# Patient Record
Sex: Female | Born: 1990 | Race: White | Hispanic: No | Marital: Single | Smoking: Never smoker
Health system: Southern US, Community
[De-identification: ages and names within clinical notes are randomized; demographics above are authoritative.]

## PROBLEM LIST (undated history)

## (undated) DIAGNOSIS — S42309A Unspecified fracture of shaft of humerus, unspecified arm, initial encounter for closed fracture: Secondary | ICD-10-CM

## (undated) DIAGNOSIS — R519 Headache, unspecified: Secondary | ICD-10-CM

## (undated) DIAGNOSIS — N946 Dysmenorrhea, unspecified: Secondary | ICD-10-CM

## (undated) DIAGNOSIS — N939 Abnormal uterine and vaginal bleeding, unspecified: Secondary | ICD-10-CM

## (undated) HISTORY — PX: FRACTURE SURGERY: SHX138

## (undated) HISTORY — PX: ABDOMINAL HYSTERECTOMY: SHX81

---
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
## 2015-06-12 ENCOUNTER — Encounter: Payer: Self-pay | Admitting: *Deleted

## 2015-06-15 ENCOUNTER — Ambulatory Visit: Payer: Managed Care, Other (non HMO) | Admitting: Anesthesiology

## 2015-06-15 ENCOUNTER — Encounter
Admission: RE | Disposition: A | Discharge status: Home / Self Care | Payer: Self-pay | Source: Ambulatory Visit | Attending: Gastroenterology

## 2015-06-15 ENCOUNTER — Encounter: Payer: Self-pay | Admitting: *Deleted

## 2015-06-15 ENCOUNTER — Ambulatory Visit
Admission: RE | Admit: 2015-06-15 | Discharge: 2015-06-15 | Disposition: A | Discharge status: Home / Self Care | Payer: Managed Care, Other (non HMO) | Source: Ambulatory Visit | Attending: Gastroenterology | Admitting: Gastroenterology

## 2015-06-15 DIAGNOSIS — N939 Abnormal uterine and vaginal bleeding, unspecified: Secondary | ICD-10-CM | POA: Diagnosis not present

## 2015-06-15 DIAGNOSIS — N946 Dysmenorrhea, unspecified: Secondary | ICD-10-CM | POA: Insufficient documentation

## 2015-06-15 DIAGNOSIS — Z79899 Other long term (current) drug therapy: Secondary | ICD-10-CM | POA: Insufficient documentation

## 2015-06-15 DIAGNOSIS — R197 Diarrhea, unspecified: Secondary | ICD-10-CM | POA: Diagnosis present

## 2015-06-15 HISTORY — PX: COLONOSCOPY WITH PROPOFOL: SHX5780

## 2015-06-15 HISTORY — DX: Unspecified fracture of shaft of humerus, unspecified arm, initial encounter for closed fracture: S42.309A

## 2015-06-15 HISTORY — DX: Abnormal uterine and vaginal bleeding, unspecified: N93.9

## 2015-06-15 HISTORY — DX: Dysmenorrhea, unspecified: N94.6

## 2015-06-15 LAB — C DIFFICILE QUICK SCREEN W PCR REFLEX
C DIFFICILE (CDIFF) INTERP: NEGATIVE
C DIFFICILE (CDIFF) TOXIN: NEGATIVE
C DIFFICLE (CDIFF) ANTIGEN: NEGATIVE

## 2015-06-15 LAB — POCT PREGNANCY, URINE: PREG TEST UR: NEGATIVE

## 2015-06-15 SURGERY — COLONOSCOPY WITH PROPOFOL
Anesthesia: General

## 2015-06-15 MED ORDER — PROPOFOL 500 MG/50ML IV EMUL
INTRAVENOUS | Status: DC | PRN
  Administered 2015-06-15: 120 ug/kg/min via INTRAVENOUS

## 2015-06-15 MED ORDER — LIDOCAINE HCL (CARDIAC) 20 MG/ML IV SOLN
INTRAVENOUS | Status: DC | PRN
  Administered 2015-06-15: 50 mg via INTRAVENOUS

## 2015-06-15 MED ORDER — PROPOFOL 10 MG/ML IV BOLUS
INTRAVENOUS | Status: DC | PRN
  Administered 2015-06-15 (×2): 30 mg via INTRAVENOUS
  Administered 2015-06-15: 70 mg via INTRAVENOUS

## 2015-06-15 MED ORDER — MIDAZOLAM HCL 5 MG/5ML IJ SOLN
INTRAMUSCULAR | Status: DC | PRN
  Administered 2015-06-15: 1 mg via INTRAVENOUS

## 2015-06-15 MED ORDER — SODIUM CHLORIDE 0.9 % IV SOLN
INTRAVENOUS | Status: DC
  Administered 2015-06-15: 14:00:00 via INTRAVENOUS

## 2015-06-15 MED ORDER — FENTANYL CITRATE (PF) 100 MCG/2ML IJ SOLN
INTRAMUSCULAR | Status: DC | PRN
  Administered 2015-06-15: 50 ug via INTRAVENOUS

## 2015-06-15 NOTE — Op Note (Signed)
[facility] Gastroenterology Patient Name: Nicole Boone Procedure Date: 06/15/2015 2:59 PM MRN: [id number] Account #: [id number] Date of Birth: February 09, 1991 Admit Type: Outpatient Age: 24 Room: [facility] ENDO ROOM 2 Gender: Female Note Status: Finalized Procedure:         Colonoscopy Indications:       This is the patient's first colonoscopy, Chronic diarrhea Patient Profile:   This is a 24 year old female. Providers:         Gerrit Heck. Rayann Heman, MD Medicines:         Propofol per Anesthesia Complications:     No immediate complications. Procedure:         Pre-Anesthesia Assessment:                    - Prior to the procedure, a History and Physical was                     performed, and patient medications, allergies and                     sensitivities were reviewed. The patient's tolerance of                     previous anesthesia was reviewed.                    After obtaining informed consent, the colonoscope was                     passed under direct vision. Throughout the procedure, the                     patient's blood pressure, pulse, and oxygen saturations                     were monitored continuously. The Colonoscope was                     introduced through the anus and advanced to the the                     terminal ileum. The colonoscopy was performed without                     difficulty. The patient tolerated the procedure well. The                     quality of the bowel preparation was excellent. Did not                     retroflex in rectum due to pt intolerance. Findings:      The perianal and digital rectal examinations were normal.      The terminal ileum appeared normal.      The entire examined colon appeared normal.      Biopsies for histology were taken with a cold forceps from the right       colon, left colon and rectum for evaluation of microscopic colitis. Impression:        - The examined portion of the ileum was normal.               - The entire examined colon is normal.                    - No specimens collected.  Recommendation:    - Observe patient in GI recovery unit.                    - Resume regular diet.                    - Continue present medications.                    - Await pathology results.                    - Send stool for c.diff testing again today given sister                     with c.diff                    - If c.diff negative today, repeat o andp and c.diff in 2                     - 3 days once back to baseline stool pattern.                    - Return to GI clinic.                    - The findings and recommendations were discussed with the                     patient.                    - The findings and recommendations were discussed with the                     patient's family. Procedure Code(s): --- Professional ---                    [phone], Colonoscopy, flexible; with biopsy, single or                     multiple CPT copyright 2014 American Medical Association. All rights reserved. The codes documented in this report are preliminary and upon coder review may  be revised to meet current compliance requirements. Mellody Life, MD 06/15/2015 3:29:08 PM This report has been signed electronically. Number of Addenda: 0 Note Initiated On: 06/15/2015 2:59 PM Scope Withdrawal Time: 0 hours 11 minutes 21 seconds  Total Procedure Duration: 0 hours 16 minutes 31 seconds       [facility]

## 2015-06-15 NOTE — H&P (Signed)
  Primary Care Physician:  [facility]  Pre-Procedure History & Physical: HPI:  Nicole Boone is a 24 y.o. female is here for an colonoscopy.   Past Medical History  Diagnosis Date  . Dysmenorrhea   . Abnormal uterine bleeding   . Broken arm 2007    left    History reviewed. No pertinent past surgical history.  Prior to Admission medications   Medication Sig Start Date End Date Taking? Authorizing Provider  levonorgestrel-ethinyl estradiol (AVIANE,ALESSE,LESSINA) 0.1-20 MG-MCG tablet Take 1 tablet by mouth daily.   Yes Historical Provider, MD  metroNIDAZOLE (FLAGYL) 500 MG tablet Take 500 mg by mouth 3 (three) times daily.    Historical Provider, MD    Allergies as of 06/02/2015  . (Not on File)    History reviewed. No pertinent family history.  Social History   Social History  . Marital Status: Single    Spouse Name: N/A  . Number of Children: N/A  . Years of Education: N/A   Occupational History  . Not on file.   Social History Main Topics  . Smoking status: Never Smoker   . Smokeless tobacco: Never Used  . Alcohol Use: 0.6 oz/week    1 Glasses of wine per week  . Drug Use: No  . Sexual Activity: Not on file   Other Topics Concern  . Not on file   Social History Narrative     Physical Exam: BP 117/78 mmHg  Pulse 99  Temp(Src) 98.6 F (37 C) (Tympanic)  Resp 14  Ht 5\' 9"  (1.753 m)  Wt 81.647 kg (180 lb)  BMI 26.57 kg/m2  SpO2 98%  LMP 06/15/2015 General:   Alert,  pleasant and cooperative in NAD Head:  Normocephalic and atraumatic. Neck:  Supple; no masses or thyromegaly. Lungs:  Clear throughout to auscultation.    Heart:  Regular rate and rhythm. Abdomen:  Soft, nontender and nondistended. Normal bowel sounds, without guarding, and without rebound.   Neurologic:  Alert and  oriented x4;  grossly normal neurologically.  Impression/Plan: Nicole Boone is here for an colonoscopy to be performed for diarrhea  Risks, benefits,  limitations, and alternatives regarding  colonoscopy have been reviewed with the patient.  Questions have been answered.  All parties agreeable.   Josefine Class, MD  06/15/2015, 2:59 PM

## 2015-06-15 NOTE — Anesthesia Postprocedure Evaluation (Signed)
  Anesthesia Post-op Note  Patient: Nicole Boone  Procedure(s) Performed: Procedure(s): COLONOSCOPY WITH PROPOFOL (N/A)  Anesthesia type:General  Patient location: PACU  Post pain: Pain level controlled  Post assessment: Post-op Vital signs reviewed, Patient's Cardiovascular Status Stable, Respiratory Function Stable, Patent Airway and No signs of Nausea or vomiting  Post vital signs: Reviewed and stable  Last Vitals:  Filed Vitals:   06/15/15 1600  BP: 97/65  Pulse: 74  Temp:   Resp: 16    Level of consciousness: awake, alert  and patient cooperative  Complications: No apparent anesthesia complications

## 2015-06-15 NOTE — Anesthesia Preprocedure Evaluation (Signed)
Anesthesia Evaluation  Patient identified by MRN, date of birth, ID band Patient awake    Reviewed: Allergy & Precautions, NPO status , Patient's Chart, lab work & pertinent test results, reviewed documented beta blocker date and time   Airway Mallampati: II  TM Distance: >3 FB     Dental  (+) Chipped   Pulmonary          Cardiovascular     Neuro/Psych    GI/Hepatic   Endo/Other    Renal/GU      Musculoskeletal   Abdominal   Peds  Hematology   Anesthesia Other Findings   Reproductive/Obstetrics                             Anesthesia Physical Anesthesia Plan  ASA: II  Anesthesia Plan: General   Post-op Pain Management:    Induction:   Airway Management Planned: Nasal Cannula  Additional Equipment:   Intra-op Plan:   Post-operative Plan:   Informed Consent: I have reviewed the patients History and Physical, chart, labs and discussed the procedure including the risks, benefits and alternatives for the proposed anesthesia with the patient or authorized representative who has indicated his/her understanding and acceptance.     Plan Discussed with: CRNA  Anesthesia Plan Comments:         Anesthesia Quick Evaluation  

## 2015-06-15 NOTE — Transfer of Care (Signed)
Immediate Anesthesia Transfer of Care Note  Patient: Nicole Boone  Procedure(s) Performed: Procedure(s): COLONOSCOPY WITH PROPOFOL (N/A)  Patient Location: PACU  Anesthesia Type:General  Level of Consciousness: awake  Airway & Oxygen Therapy: Patient Spontanous Breathing  Post-op Assessment: Report given to RN  Post vital signs: Reviewed  Last Vitals:  Filed Vitals:   06/15/15 1529  BP: 94/59  Pulse:   Temp: 36.2 C  Resp: 22    Complications: No apparent anesthesia complications

## 2015-06-16 ENCOUNTER — Encounter: Payer: Self-pay | Admitting: Gastroenterology

## 2015-06-17 LAB — SURGICAL PATHOLOGY

## 2017-02-02 ENCOUNTER — Other Ambulatory Visit: Payer: Self-pay | Admitting: Nurse Practitioner

## 2017-02-02 DIAGNOSIS — G43419 Hemiplegic migraine, intractable, without status migrainosus: Secondary | ICD-10-CM

## 2017-02-09 ENCOUNTER — Ambulatory Visit
Admission: RE | Admit: 2017-02-09 | Discharge: 2017-02-09 | Disposition: A | Discharge status: Home / Self Care | Payer: 59 | Source: Ambulatory Visit | Attending: Nurse Practitioner | Admitting: Nurse Practitioner

## 2017-02-09 DIAGNOSIS — G43419 Hemiplegic migraine, intractable, without status migrainosus: Secondary | ICD-10-CM | POA: Insufficient documentation

## 2017-02-09 MED ORDER — GADOBENATE DIMEGLUMINE 529 MG/ML IV SOLN
20.0000 mL | Freq: Once | INTRAVENOUS | Status: AC | PRN
  Administered 2017-02-09: 19 mL via INTRAVENOUS

## 2017-09-20 ENCOUNTER — Other Ambulatory Visit
Admission: RE | Admit: 2017-09-20 | Discharge: 2017-09-20 | Disposition: A | Discharge status: Home / Self Care | Payer: 59 | Source: Ambulatory Visit | Attending: Student | Admitting: Student

## 2017-09-20 DIAGNOSIS — K529 Noninfective gastroenteritis and colitis, unspecified: Secondary | ICD-10-CM | POA: Insufficient documentation

## 2017-09-20 LAB — GASTROINTESTINAL PANEL BY PCR, STOOL (REPLACES STOOL CULTURE)

## 2017-09-20 LAB — C DIFFICILE QUICK SCREEN W PCR REFLEX
C DIFFICILE (CDIFF) TOXIN: NEGATIVE
C DIFFICLE (CDIFF) ANTIGEN: POSITIVE — AB

## 2017-09-20 LAB — CLOSTRIDIUM DIFFICILE BY PCR, REFLEXED: CDIFFPCR: NEGATIVE

## 2017-09-21 LAB — PANCREATIC ELASTASE, FECAL: Pancreatic Elastase-1, Stool: >500 ug Elast./g (ref >200)

## 2017-09-25 LAB — CALPROTECTIN, FECAL: Calprotectin, Fecal: <16 ug/g (ref 0–120)

## 2018-02-09 IMAGING — MR MR HEAD WO/W CM
10 of 12 series · 30 of 48 positions shown · IV contrast (19ML MULTIHANCE)
Comparison: None.

CLINICAL DATA: Chronic migraines, worst in the past 6 months.
Nausea, vomiting, blurred vision, and left-sided numbness.

EXAM:
MRI HEAD WITHOUT AND WITH CONTRAST
TECHNIQUE: Multiplanar, multiecho pulse sequences of the brain and surrounding
structures were obtained without and with intravenous contrast.
CONTRAST:  19mL MULTIHANCE GADOBENATE DIMEGLUMINE 529 MG/ML IV SOLN

[Series 2: T1 · sagittal · 5.0mm · 0.47mm/px · 1 of 22 slices shown (1 of 2)]
[im 1/22]
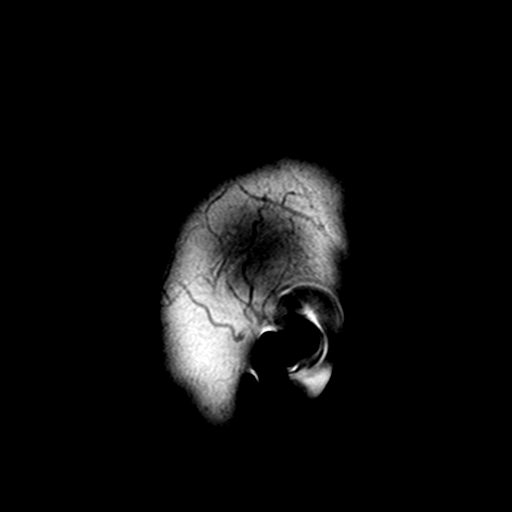

[Series 4: DWI · axial · 3.0mm · 0.94mm/px · z∈[-93,+53]mm · 3 of 50 slices shown (1 of 2)]
[im 1/50]
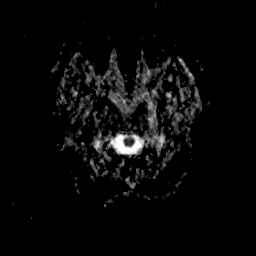
[im 25/50]
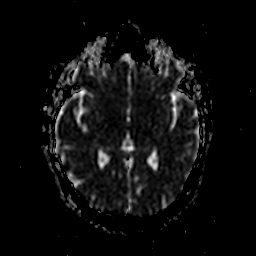
[im 50/50]
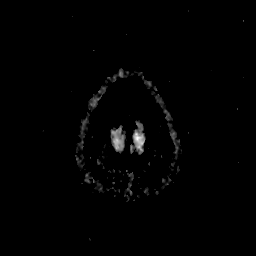

[Series 7: DWI · coronal · 5.0mm · 1.80mm/px · 2 of 38 slices shown (2 of 2)]
[im 1/38]
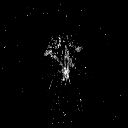
[im 38/38]
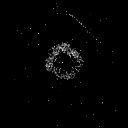

[Series 9: T2 · axial · 5.0mm · 0.45mm/px · z∈[-95,+58]mm · 2 of 23 slices shown (1 of 2)]
[im 1/23]
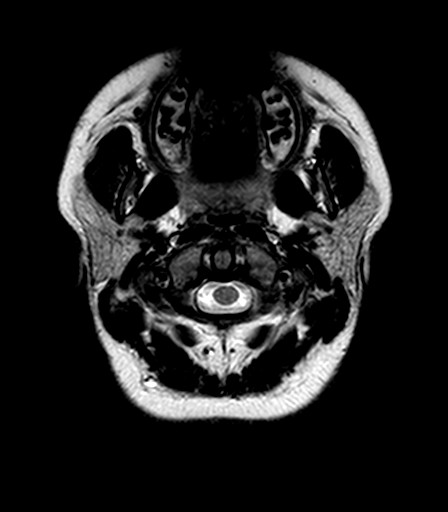
[im 23/23]
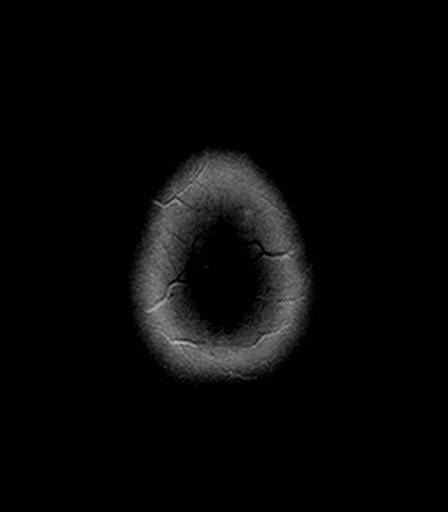

[Series 10: FLAIR · axial · 3.0mm · 0.90mm/px · z∈[-91,+56]mm · 4 of 50 slices shown]
[im 1/50]
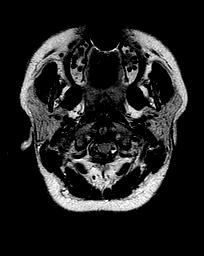
[im 17/50]
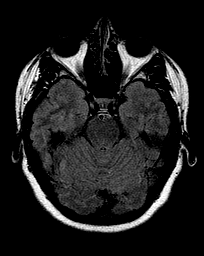
[im 33/50]
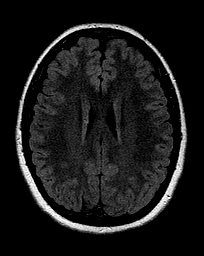
[im 50/50]
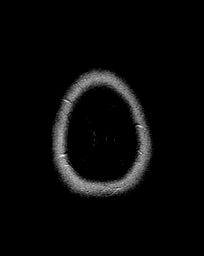

[Series 11: T2 · axial · 5.0mm · 0.45mm/px · z∈[-95,+58]mm · 2 of 23 slices shown (2 of 2)]
[im 1/23]
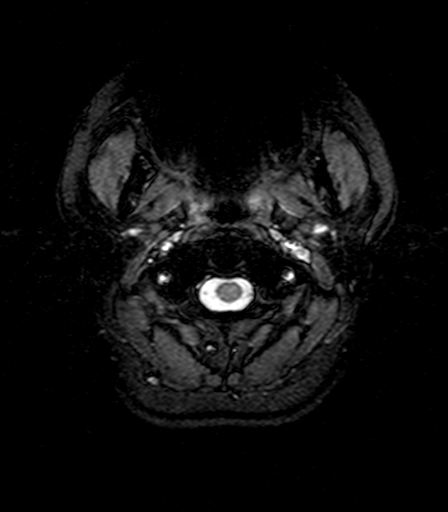
[im 23/23]
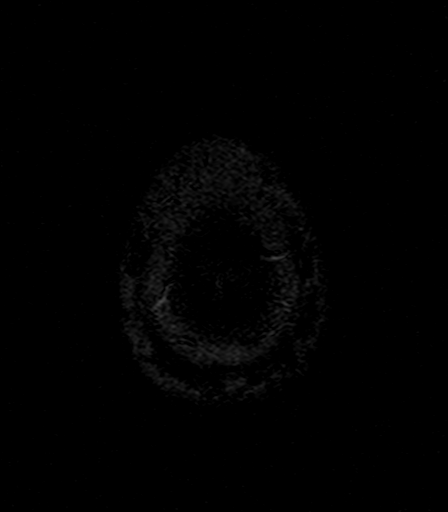

[Series 12: T1 · axial · 1.0mm · 0.45mm/px · z∈[-97,-54]mm · 3 of 160 slices shown (2 of 2)]
[im 1/160]
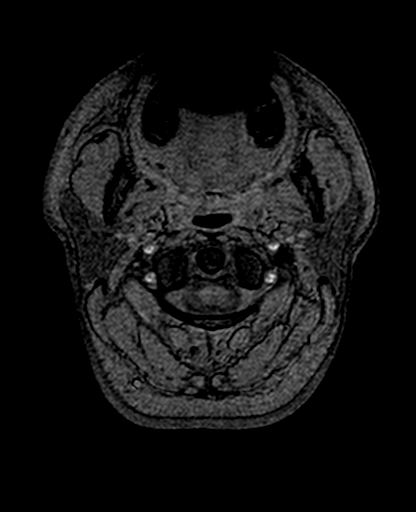
[im 29/160]
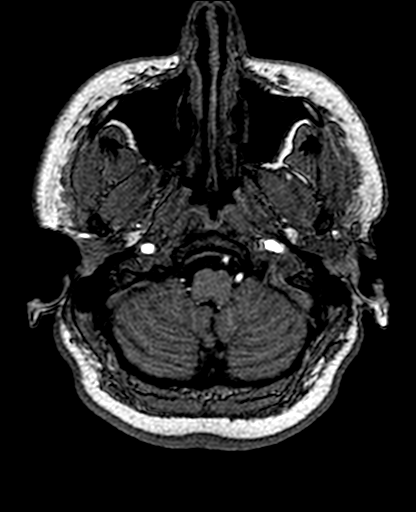
[im 44/160]
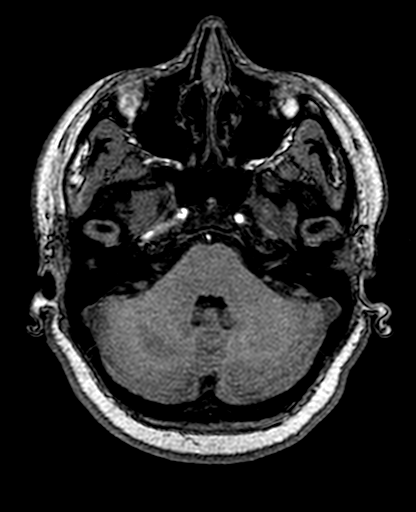

[Series 13: T2 post-contrast · coronal · 5.0mm · 0.45mm/px · 2 of 27 slices shown]
[im 1/27]
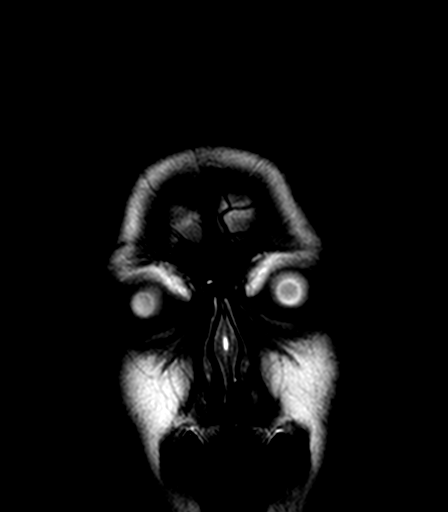
[im 27/27]
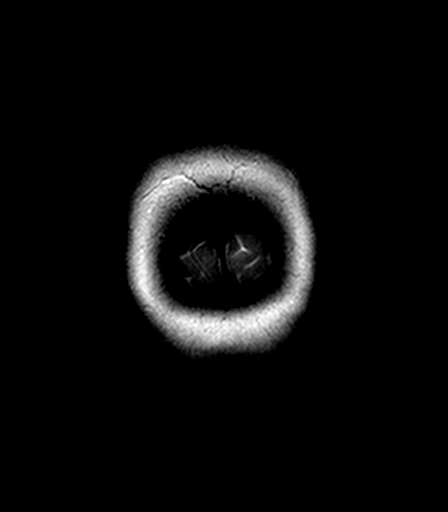

[Series 14: T1 post-contrast · axial · 1.0mm · 0.45mm/px · z∈[-97,+61]mm · 9 of 160 slices shown (1 of 2)]
[im 1/160]
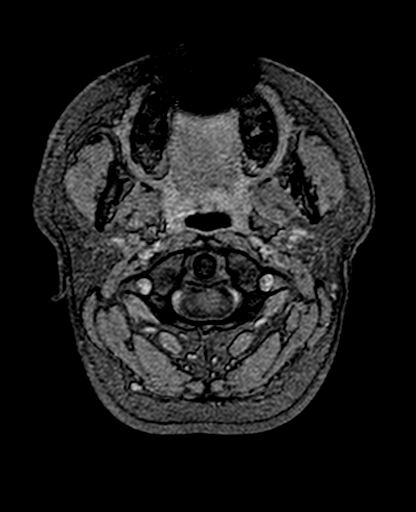
[im 29/160]
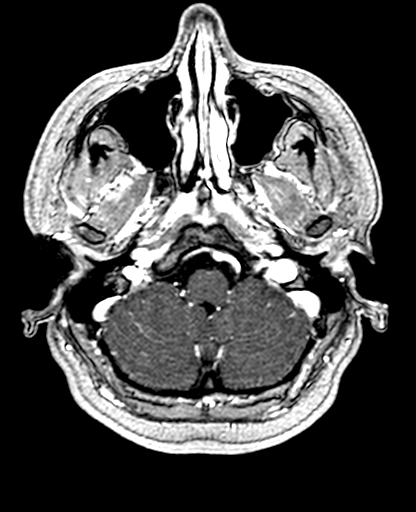
[im 44/160]
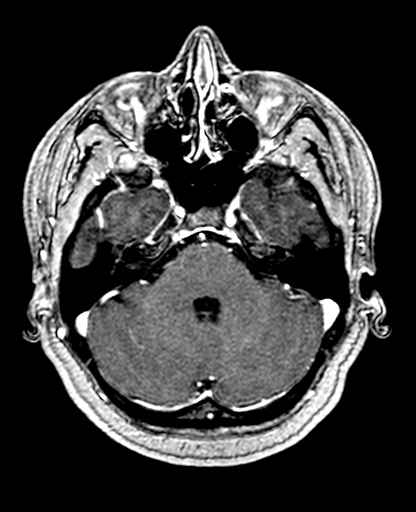
[im 73/160]
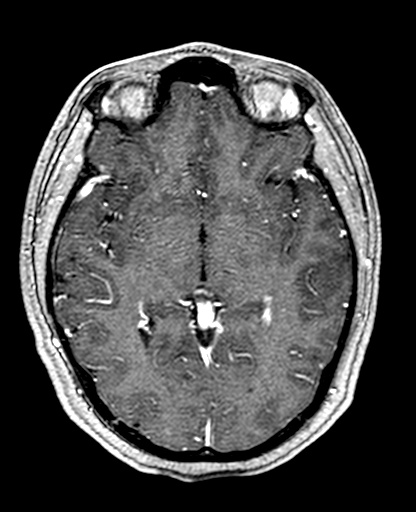
[im 87/160]
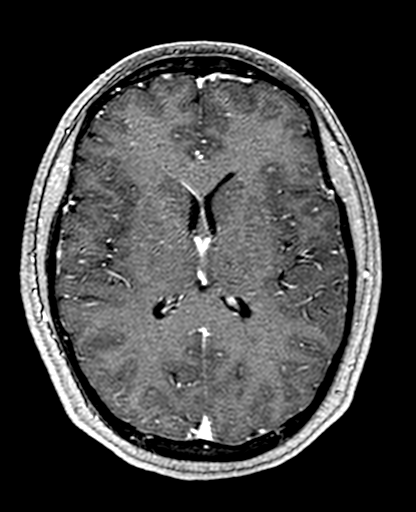
[im 116/160]
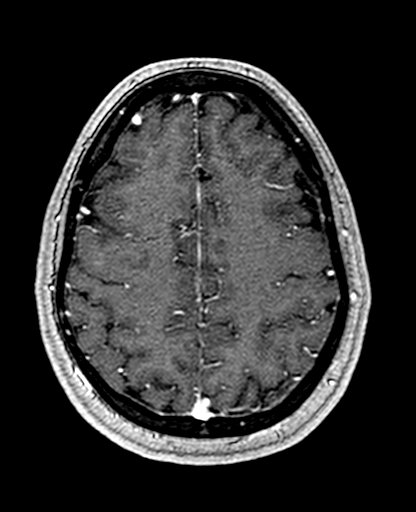
[im 131/160]
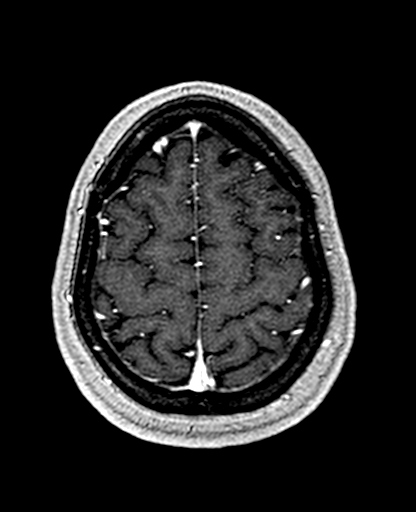
[im 145/160]
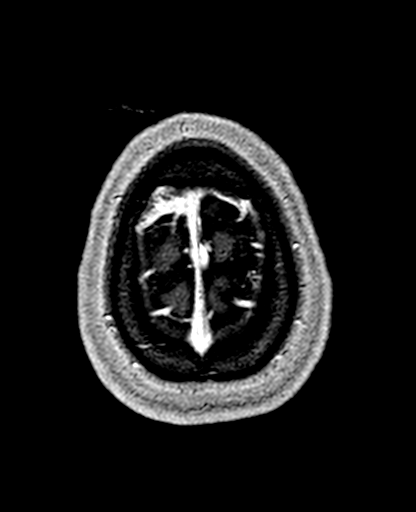
[im 160/160]
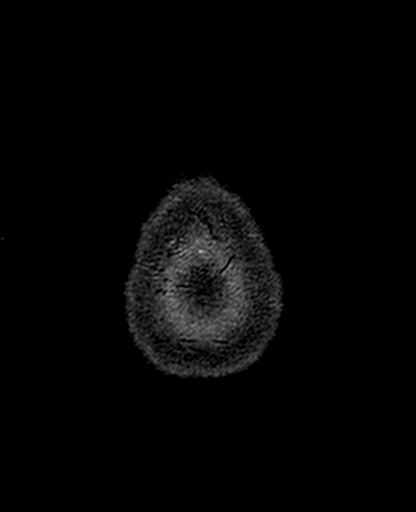

[Series 15: T1 post-contrast · coronal · 5.0mm · 0.45mm/px · 2 of 27 slices shown (2 of 2)]
[im 1/27]
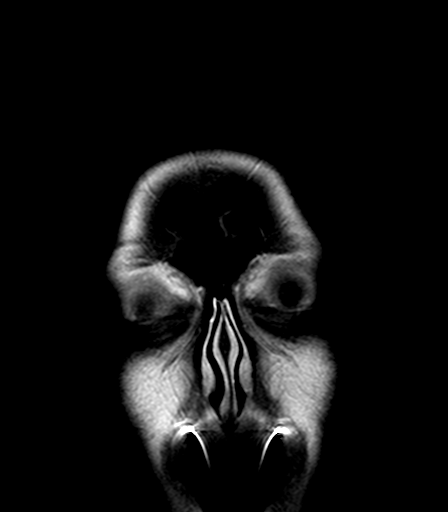
[im 27/27]
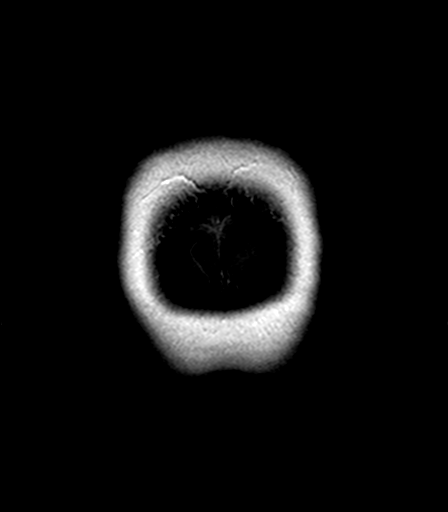

[30 of 48 positions shown; findings below may reference images not displayed]

FINDINGS: Brain: There is no evidence of acute infarct, intracranial
hemorrhage, mass, midline shift, or extra-axial fluid collection.
The ventricles and sulci are normal. The brain is normal in signal.
No abnormal enhancement is identified.

Vascular: Major intracranial vascular flow voids are preserved.

Skull and upper cervical spine: Unremarkable bone marrow signal.

Sinuses/Orbits: Unremarkable orbits. Minimal left greater than right
maxillary sinus mucosal thickening. Clear mastoid air cells.

Other: None.
IMPRESSION: Unremarkable appearance of the brain.

## 2018-03-17 ENCOUNTER — Other Ambulatory Visit: Payer: Self-pay

## 2018-03-17 DIAGNOSIS — H60331 Swimmer's ear, right ear: Secondary | ICD-10-CM | POA: Insufficient documentation

## 2018-03-17 DIAGNOSIS — H9201 Otalgia, right ear: Secondary | ICD-10-CM | POA: Diagnosis present

## 2018-03-17 DIAGNOSIS — Z79899 Other long term (current) drug therapy: Secondary | ICD-10-CM | POA: Insufficient documentation

## 2018-03-17 NOTE — ED Triage Notes (Signed)
Reports went to walking in yesterday and told her ear drum ruptured.  Reports now ear (right) is swollen shut and can't hear out of it.

## 2018-03-18 ENCOUNTER — Emergency Department
Admission: EM | Admit: 2018-03-18 | Discharge: 2018-03-18 | Disposition: A | Discharge status: Home / Self Care | Payer: 59 | Attending: Emergency Medicine | Admitting: Emergency Medicine

## 2018-03-18 DIAGNOSIS — H60331 Swimmer's ear, right ear: Secondary | ICD-10-CM

## 2018-03-18 MED ORDER — IBUPROFEN 600 MG PO TABS
600.0000 mg | ORAL_TABLET | Freq: Once | ORAL | Status: AC
  Administered 2018-03-18: 600 mg via ORAL
  Filled 2018-03-18: qty 1

## 2018-03-18 MED ORDER — IBUPROFEN 600 MG PO TABS: 600.0000 mg | ORAL_TABLET | Freq: Three times a day (TID) | ORAL | 0 refills | Status: DC | PRN

## 2018-03-18 MED ORDER — TETRACAINE HCL 0.5 % OP SOLN
2.0000 [drp] | Freq: Once | OPHTHALMIC | Status: AC
  Administered 2018-03-18: 2 [drp] via OPHTHALMIC

## 2018-03-18 MED ORDER — HYDROCODONE-ACETAMINOPHEN 5-325 MG PO TABS: 1.0000 | ORAL_TABLET | Freq: Four times a day (QID) | ORAL | 0 refills | Status: DC | PRN

## 2018-03-18 MED ORDER — TETRACAINE HCL 0.5 % OP SOLN
OPHTHALMIC | Status: AC
  Filled 2018-03-18: qty 4

## 2018-03-18 MED ORDER — HYDROCODONE-ACETAMINOPHEN 5-325 MG PO TABS
1.0000 | ORAL_TABLET | Freq: Once | ORAL | Status: AC
  Administered 2018-03-18: 1 via ORAL
  Filled 2018-03-18: qty 1

## 2018-03-18 NOTE — Discharge Instructions (Signed)
Please do not go swimming until your ear is completely healed.  Use your eardrops in your right ear 4 drops 2 times a day for a full week and make an appointment to follow-up with the ear nose and throat doctor within 1 to 2 weeks for recheck.  Return to the emergency department sooner for any concerns.  It was a pleasure to take care of you today, and thank you for coming to our emergency department.  If you have any questions or concerns before leaving please ask the nurse to grab me and I'm more than happy to go through your aftercare instructions again.  If you were prescribed any opioid pain medication today such as Norco, Vicodin, Percocet, morphine, hydrocodone, or oxycodone please make sure you do not drive when you are taking this medication as it can alter your ability to drive safely.  If you have any concerns once you are home that you are not improving or are in fact getting worse before you can make it to your follow-up appointment, please do not hesitate to call 911 and come back for further evaluation.  Darel Hong, MD  While here in the ER today you received very powerful medicine that makes it unsafe for you to drive for the rest of the day.  Do not drive until tomorrow.

## 2018-03-18 NOTE — ED Notes (Signed)
Visitor to stat desk asking about wait time. Visitor given update on wait time. Visitor verbalizes understanding.  

## 2018-03-18 NOTE — ED Provider Notes (Signed)
[facility] Emergency Department Provider Note  ____________________________________________   First MD Initiated Contact with Patient 03/18/18 0202     (approximate)  I have reviewed the triage vital signs and the nursing notes.   HISTORY  Chief Complaint Otalgia   HPI Nicole Boone is a 27 y.o. female comes to the emergency department with 2 days of severe right ear pain.  She went to a walk-in clinic yesterday who prescribed her Ciprodex and Augmentin however her pain has progressively worsened.  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  She had no fevers or chills.  The pain is worse when touching her ear and she has decreased hearing.  No dental pain.    Past Medical History:  Diagnosis Date  . Abnormal uterine bleeding   . Broken arm 2007   left  . Dysmenorrhea     There are no active problems to display for this patient.   Past Surgical History:  Procedure Laterality Date  . COLONOSCOPY WITH PROPOFOL N/A 06/15/2015   Procedure: COLONOSCOPY WITH PROPOFOL;  Surgeon: Josefine Class, MD;  Location: [facility] ENDOSCOPY;  Service: Endoscopy;  Laterality: N/A;    Prior to Admission medications   Medication Sig Start Date End Date Taking? Authorizing Provider  HYDROcodone-acetaminophen (NORCO) 5-325 MG tablet Take 1 tablet by mouth every 6 (six) hours as needed for up to 7 doses for severe pain. 03/18/18   Darel Hong, MD  ibuprofen (ADVIL,MOTRIN) 600 MG tablet Take 1 tablet (600 mg total) by mouth every 8 (eight) hours as needed. 03/18/18   Darel Hong, MD  levonorgestrel-ethinyl estradiol (AVIANE,ALESSE,LESSINA) 0.1-20 MG-MCG tablet Take 1 tablet by mouth daily.    [provider]  metroNIDAZOLE (FLAGYL) 500 MG tablet Take 500 mg by mouth 3 (three) times daily.    [provider]    Allergies Benadryl [diphenhydramine hcl]  No family history on file.  Social History Social History   Tobacco Use   . Smoking status: Never Smoker  . Smokeless tobacco: Never Used  Substance Use Topics  . Alcohol use: Yes    Alcohol/week: 1.0 standard drinks    Types: 1 Glasses of wine per week  . Drug use: No    Review of Systems Constitutional: No fever/chills ENT: Positive for otalgia Cardiovascular: Denies chest pain. Respiratory: Denies shortness of breath. Gastrointestinal: No abdominal pain.  No nausea, no vomiting.   Neurological: Negative for headaches   ____________________________________________   PHYSICAL EXAM:  VITAL SIGNS: ED Triage Vitals  Enc Vitals Group     BP 03/17/18 2229 127/84     Pulse Rate 03/17/18 2229 (!) 124     Resp 03/17/18 2229 20     Temp 03/17/18 2229 99.6 F (37.6 C)     Temp Source 03/17/18 2229 Oral     SpO2 03/17/18 2229 97 %     Weight 03/17/18 2229 210 lb (95.3 kg)     Height 03/17/18 2229 5\' 9"  (1.753 m)     Head Circumference --      Peak Flow --      Pain Score 03/17/18 2228 7     Pain Loc --      Pain Edu? --      Excl. in [place]? --     Constitutional: Alert and oriented x4 appears obviously uncomfortable nontoxic no diaphoresis Head: Right sided otitis externa difficult to even visualize the tympanic membrane. Nose: No congestion/rhinnorhea. Mouth/Throat: No trismus Neck:  No stridor.   Cardiovascular: Tachycardic regular rhythm Respiratory: Normal respiratory effort.  No retractions. Neurologic:  Normal speech and language. No gross focal neurologic deficits are appreciated.  Skin:  Skin is warm, dry and intact. No rash noted.    ____________________________________________  LABS (all labs ordered are listed, but only abnormal results are displayed)  Labs Reviewed - No data to display   __________________________________________  EKG   ____________________________________________  RADIOLOGY   ____________________________________________   DIFFERENTIAL includes but not limited to  Otitis media, otitis externa,  malignant otitis externa, ruptured tympanic membrane   PROCEDURES  Procedure(s) performed: no  Procedures  Critical Care performed: no  ____________________________________________   INITIAL IMPRESSION / ASSESSMENT AND PLAN / ED COURSE  Pertinent labs & imaging results that were available during my care of the patient were reviewed by me and considered in my medical decision making (see chart for details).   As part of my medical decision making, I reviewed the following data within the [place] History obtained from family if available, nursing notes, old chart and ekg, as well as notes from prior ED visits.  Patient arrives obviously uncomfortable appearing with a clear otitis externa.  I placed tetracaine drops in her ear with improvement in her pain and then placed an ear wick and put her Ciprodex on top.  I will give her 1 week ENT follow-up.  Strict return precautions have been given and she understands to not go swimming until her symptoms are resolved.      ____________________________________________   FINAL CLINICAL IMPRESSION(S) / ED DIAGNOSES  Final diagnoses:  Acute swimmer's ear of right side      NEW MEDICATIONS STARTED DURING THIS VISIT:  Discharge Medication List as of 03/18/2018  2:15 AM    START taking these medications   Details  HYDROcodone-acetaminophen (NORCO) 5-325 MG tablet Take 1 tablet by mouth every 6 (six) hours as needed for up to 7 doses for severe pain., Starting Sun 03/18/2018, Print    ibuprofen (ADVIL,MOTRIN) 600 MG tablet Take 1 tablet (600 mg total) by mouth every 8 (eight) hours as needed., Starting Sun 03/18/2018, Print         Note:  This document was prepared using Dragon voice recognition software and may include unintentional dictation errors.      Darel Hong, MD 03/18/18 [phone]

## 2018-05-10 LAB — OB RESULTS CONSOLE GC/CHLAMYDIA
Chlamydia: NEGATIVE
Gonorrhea: NEGATIVE

## 2018-05-11 ENCOUNTER — Other Ambulatory Visit: Payer: Self-pay | Admitting: Obstetrics and Gynecology

## 2018-05-11 DIAGNOSIS — Z369 Encounter for antenatal screening, unspecified: Secondary | ICD-10-CM

## 2018-05-11 LAB — OB RESULTS CONSOLE HIV ANTIBODY (ROUTINE TESTING): HIV: NONREACTIVE

## 2018-05-11 LAB — OB RESULTS CONSOLE RPR: RPR: NONREACTIVE

## 2018-05-11 LAB — OB RESULTS CONSOLE HEPATITIS B SURFACE ANTIGEN: Hepatitis B Surface Ag: NEGATIVE

## 2018-05-11 LAB — OB RESULTS CONSOLE RUBELLA ANTIBODY, IGM: RUBELLA: IMMUNE

## 2018-05-11 LAB — OB RESULTS CONSOLE VARICELLA ZOSTER ANTIBODY, IGG: Varicella: IMMUNE

## 2018-05-24 ENCOUNTER — Ambulatory Visit: Payer: 59

## 2018-08-08 NOTE — L&D Delivery Note (Signed)
Delivery Note At 6:55 AM a viable and healthy female "Nicole Boone" was delivered via Vaginal, Spontaneous (Presentation:ROA).  APGAR: 8, 9; weight 5 lb 15.6 oz (2710 g).   Placenta status: spontaneous , 3vc Anesthesia:  Local for repair Episiotomy: None Lacerations:  2nd deg Suture Repair: 2.0 vicryl Est. Blood Loss (mL):  300  Mom to postpartum.  Baby to Nursery.  27yo G2P0010 at 34+5wks presented in active labor with PPROM for clear fluid. Received 1 dose BMZ and 2g amicillin for GBS unknown, which was not repeated when GBS returned negative. Progressed to fully dilated, pushed over intact perineum, delivered fetal head with a loose nuchal cord. Promptly delivered shoulders and baby passed to maternal abdomen. She was vigorous and crying. After delayed cord clamping, FOB cut the cord. Placenta delivered spontaneously and intact. Shallow 2nd deg lac repaired in standard fashion. Bleeding minimal. Fundus firm. PPD pitocin running. Tolerated well.  Benjaman Kindler 10/24/2018, 7:21 AM

## 2018-10-23 ENCOUNTER — Inpatient Hospital Stay
Admission: EM | Admit: 2018-10-23 | Discharge: 2018-10-25 | DRG: 807 | Disposition: A | Discharge status: Home / Self Care | Payer: Managed Care, Other (non HMO) | Attending: Obstetrics and Gynecology | Admitting: Obstetrics and Gynecology

## 2018-10-23 ENCOUNTER — Encounter: Payer: Self-pay | Admitting: *Deleted

## 2018-10-23 DIAGNOSIS — R633 Feeding difficulties: Secondary | ICD-10-CM

## 2018-10-23 DIAGNOSIS — R6339 Other feeding difficulties: Secondary | ICD-10-CM

## 2018-10-23 DIAGNOSIS — O42919 Preterm premature rupture of membranes, unspecified as to length of time between rupture and onset of labor, unspecified trimester: Secondary | ICD-10-CM | POA: Diagnosis present

## 2018-10-23 DIAGNOSIS — O99214 Obesity complicating childbirth: Secondary | ICD-10-CM | POA: Diagnosis present

## 2018-10-23 DIAGNOSIS — E669 Obesity, unspecified: Secondary | ICD-10-CM | POA: Diagnosis present

## 2018-10-23 DIAGNOSIS — Z3A34 34 weeks gestation of pregnancy: Secondary | ICD-10-CM

## 2018-10-23 DIAGNOSIS — O42913 Preterm premature rupture of membranes, unspecified as to length of time between rupture and onset of labor, third trimester: Principal | ICD-10-CM | POA: Diagnosis present

## 2018-10-23 MED ORDER — OXYTOCIN 10 UNIT/ML IJ SOLN
INTRAMUSCULAR | Status: AC
  Filled 2018-10-23: qty 2

## 2018-10-23 MED ORDER — ACETAMINOPHEN 325 MG PO TABS: 650.0000 mg | ORAL_TABLET | ORAL | Status: DC | PRN

## 2018-10-23 MED ORDER — SODIUM CHLORIDE 0.9 % IV SOLN
2.0000 g | Freq: Once | INTRAVENOUS | Status: AC | PRN
  Administered 2018-10-24: 2 g via INTRAVENOUS
  Filled 2018-10-23: qty 2000

## 2018-10-23 MED ORDER — LACTATED RINGERS IV SOLN: 500.0000 mL | INTRAVENOUS | Status: DC | PRN

## 2018-10-23 MED ORDER — MISOPROSTOL 200 MCG PO TABS
ORAL_TABLET | ORAL | Status: AC
  Filled 2018-10-23: qty 4

## 2018-10-23 MED ORDER — ONDANSETRON HCL 4 MG/2ML IJ SOLN: 4.0000 mg | Freq: Four times a day (QID) | INTRAMUSCULAR | Status: DC | PRN

## 2018-10-23 MED ORDER — SOD CITRATE-CITRIC ACID 500-334 MG/5ML PO SOLN: 30.0000 mL | ORAL | Status: DC | PRN

## 2018-10-23 MED ORDER — OXYTOCIN 40 UNITS IN NORMAL SALINE INFUSION - SIMPLE MED
2.5000 [IU]/h | INTRAVENOUS | Status: DC
  Administered 2018-10-24: 2.5 [IU]/h via INTRAVENOUS
  Filled 2018-10-23: qty 1000

## 2018-10-23 MED ORDER — OXYTOCIN BOLUS FROM INFUSION
500.0000 mL | Freq: Once | INTRAVENOUS | Status: AC
  Administered 2018-10-24: 500 mL via INTRAVENOUS

## 2018-10-23 MED ORDER — AMMONIA AROMATIC IN INHA
RESPIRATORY_TRACT | Status: AC
  Filled 2018-10-23: qty 10

## 2018-10-23 MED ORDER — LIDOCAINE HCL (PF) 1 % IJ SOLN
INTRAMUSCULAR | Status: AC
  Administered 2018-10-24: 30 mL via SUBCUTANEOUS
  Filled 2018-10-23: qty 30

## 2018-10-23 MED ORDER — LACTATED RINGERS IV SOLN
INTRAVENOUS | Status: DC
  Administered 2018-10-24: via INTRAVENOUS

## 2018-10-23 MED ORDER — BUTORPHANOL TARTRATE 1 MG/ML IJ SOLN
1.0000 mg | INTRAMUSCULAR | Status: DC | PRN
  Administered 2018-10-24 (×2): 1 mg via INTRAVENOUS
  Filled 2018-10-23 (×2): qty 1

## 2018-10-23 MED ORDER — LIDOCAINE HCL (PF) 1 % IJ SOLN
30.0000 mL | INTRAMUSCULAR | Status: AC | PRN
  Administered 2018-10-24: 30 mL via SUBCUTANEOUS

## 2018-10-23 NOTE — OB Triage Note (Signed)
Recvd pt from ED. Pt c/o contractions every 4-8 min that started around 1945 and had intercourse right before. No vaginal bleeding or LOF. Rates pain a 4 out of 10. Feeling baby move well.

## 2018-10-23 NOTE — H&P (Addendum)
OB ADMISSION/ HISTORY & PHYSICAL:  Admission Date: 10/23/2018  9:46 PM  Admit Diagnosis: PPROM, active labor  Nicole Boone is a 28 y.o. female presenting for clear fluid with strong contractions.  Prenatal History: G1P0   EDC : 11/30/2018, by Last Menstrual Period consistent with 6 wk ultrasound [facility]atal care at [facility] Prenatal course complicated by  - obesity  Medical / Surgical History :  Past medical history:  Past Medical History:  Diagnosis Date  . Abnormal uterine bleeding   . Broken arm 2007   left  . Dysmenorrhea      Past surgical history:  Past Surgical History:  Procedure Laterality Date  . COLONOSCOPY WITH PROPOFOL N/A 06/15/2015   Procedure: COLONOSCOPY WITH PROPOFOL;  Surgeon: Josefine Class, MD;  Location: [facility] ENDOSCOPY;  Service: Endoscopy;  Laterality: N/A;    Family History: History reviewed. No pertinent family history.   Social History:  reports that she has never smoked. She has never used smokeless tobacco. She reports current alcohol use of about 1.0 standard drinks of alcohol per week. She reports that she does not use drugs.   Allergies: Benadryl [diphenhydramine hcl]    Current Medications at time of admission:  Prior to Admission medications   Medication Sig Start Date End Date Taking? Authorizing Provider  Prenatal Vit-Fe Fumarate-FA (PRENATAL MULTIVITAMIN) TABS tablet Take 1 tablet by mouth daily at 12 noon.   Yes [provider]  HYDROcodone-acetaminophen (NORCO) 5-325 MG tablet Take 1 tablet by mouth every 6 (six) hours as needed for up to 7 doses for severe pain. 03/18/18   Darel Hong, MD  ibuprofen (ADVIL,MOTRIN) 600 MG tablet Take 1 tablet (600 mg total) by mouth every 8 (eight) hours as needed. 03/18/18   Darel Hong, MD  levonorgestrel-ethinyl estradiol (AVIANE,ALESSE,LESSINA) 0.1-20 MG-MCG tablet Take 1 tablet by mouth daily.    [provider]  metroNIDAZOLE (FLAGYL) 500 MG tablet Take 500 mg by mouth 3  (three) times daily.    [provider]     Review of Systems: Active FM onset of ctx @ 2000 currently every 3 minutes LOF  / SROM: clear bloody show no   Physical Exam:  VS: Blood pressure 130/79, pulse 97, temperature 97.7 F (36.5 C), temperature source Oral, resp. rate 16, height 5\' 9"  (1.753 m), weight 104.3 kg, last menstrual period 02/23/2018.  General: alert and oriented, appears mod distress Heart: RRR Lungs: Clear lung fields Abdomen: Gravid, soft and non-tender, non-distended / uterus: nontedner Extremities: trace edema  FHT: 135, moderate variability, +accels, no decels TOCO: q3-5 min SVE:  Dilation: 3 / Effacement (%): 80 / Station: 0    Cephalic by sutures  Prenatal Labs: Blood type/Rh    Antibody screen neg  Rubella Immune  Varicella Immune  RPR NR  HBsAg Neg  HIV NR  GC neg  Chlamydia neg  Genetic screening negative  1 hour GTT 170  3 hour GTT normal  GBS Collected and pending   Last ultrasound: 10/18/18: Growth [place]: Efw=5lb8oz (2494g)=55%, Afi=19.82cm @ 74%, Fhr=127bpm, Placenta=posterior/rt lateral, Position=vertex  Assessment: 34+[redacted] weeks gestation 1 stage of labor FHR category 1   Plan:   Admit for active labor Labs pending Epidural when desired Continuous fetal monitoring - steroids started now - GBS pending, start amp now   1. Fetal Well being  - Fetal Tracing: Cat I - Ultrasound:  reviewed, as above - Group B Streptococcus: unknown - Presentation: vtx confirmed by [place]   2. Routine OB: - Prenatal  labs reviewed, as above - Rh O positive  4. Post Partum Planning: - Infant feeding: breast - Contraception: OCPs

## 2018-10-24 ENCOUNTER — Other Ambulatory Visit: Payer: Self-pay

## 2018-10-24 ENCOUNTER — Encounter: Payer: Self-pay | Admitting: *Deleted

## 2018-10-24 LAB — WET PREP, GENITAL
Trich, Wet Prep: NONE SEEN
Yeast Wet Prep HPF POC: NONE SEEN

## 2018-10-24 LAB — CBC
HCT: 35.9 % — ABNORMAL LOW (ref 36.0–46.0)
Hemoglobin: 12.3 g/dL (ref 12.0–15.0)
MCH: 30.4 pg (ref 26.0–34.0)
MCHC: 34.3 g/dL (ref 30.0–36.0)
MCV: 88.6 fL (ref 80.0–100.0)
Platelets: 267 10*3/uL (ref 150–400)
RBC: 4.05 MIL/uL (ref 3.87–5.11)
RDW: 13.2 % (ref 11.5–15.5)
WBC: 15 10*3/uL — ABNORMAL HIGH (ref 4.0–10.5)
nRBC: 0 % (ref 0.0–0.2)

## 2018-10-24 LAB — GROUP B STREP BY PCR: Group B strep by PCR: NEGATIVE

## 2018-10-24 LAB — TYPE AND SCREEN
ABO/RH(D): O POS
Antibody Screen: NEGATIVE

## 2018-10-24 LAB — OB RESULTS CONSOLE GBS: GBS: NEGATIVE

## 2018-10-24 MED ORDER — ONDANSETRON HCL 4 MG/2ML IJ SOLN: 4.0000 mg | INTRAMUSCULAR | Status: DC | PRN

## 2018-10-24 MED ORDER — SIMETHICONE 80 MG PO CHEW: 80.0000 mg | CHEWABLE_TABLET | ORAL | Status: DC | PRN

## 2018-10-24 MED ORDER — ZOLPIDEM TARTRATE 5 MG PO TABS: 5.0000 mg | ORAL_TABLET | Freq: Every evening | ORAL | Status: DC | PRN

## 2018-10-24 MED ORDER — BENZOCAINE-MENTHOL 20-0.5 % EX AERO
1.0000 "application " | INHALATION_SPRAY | CUTANEOUS | Status: DC | PRN
  Administered 2018-10-24: 1 via TOPICAL
  Filled 2018-10-24: qty 56

## 2018-10-24 MED ORDER — OXYCODONE HCL 5 MG PO TABS: 5.0000 mg | ORAL_TABLET | ORAL | Status: DC | PRN

## 2018-10-24 MED ORDER — DIBUCAINE 1 % RE OINT: 1.0000 "application " | TOPICAL_OINTMENT | RECTAL | Status: DC | PRN

## 2018-10-24 MED ORDER — BISACODYL 10 MG RE SUPP: 10.0000 mg | Freq: Every day | RECTAL | Status: DC | PRN

## 2018-10-24 MED ORDER — SODIUM CHLORIDE 0.9% FLUSH: 3.0000 mL | INTRAVENOUS | Status: DC | PRN

## 2018-10-24 MED ORDER — WITCH HAZEL-GLYCERIN EX PADS: 1.0000 "application " | MEDICATED_PAD | CUTANEOUS | Status: DC | PRN

## 2018-10-24 MED ORDER — ACETAMINOPHEN 325 MG PO TABS: 650.0000 mg | ORAL_TABLET | ORAL | Status: DC | PRN

## 2018-10-24 MED ORDER — SENNOSIDES-DOCUSATE SODIUM 8.6-50 MG PO TABS
2.0000 | ORAL_TABLET | ORAL | Status: DC
  Administered 2018-10-25: 2 via ORAL
  Filled 2018-10-24: qty 2

## 2018-10-24 MED ORDER — FLEET ENEMA 7-19 GM/118ML RE ENEM: 1.0000 | ENEMA | Freq: Every day | RECTAL | Status: DC | PRN

## 2018-10-24 MED ORDER — BETAMETHASONE SOD PHOS & ACET 6 (3-3) MG/ML IJ SUSP
12.0000 mg | INTRAMUSCULAR | Status: DC
  Administered 2018-10-24: 12 mg via INTRAMUSCULAR
  Filled 2018-10-24: qty 2

## 2018-10-24 MED ORDER — SODIUM CHLORIDE 0.9% FLUSH: 3.0000 mL | Freq: Two times a day (BID) | INTRAVENOUS | Status: DC

## 2018-10-24 MED ORDER — TETANUS-DIPHTH-ACELL PERTUSSIS 5-2.5-18.5 LF-MCG/0.5 IM SUSP: 0.5000 mL | Freq: Once | INTRAMUSCULAR | Status: DC

## 2018-10-24 MED ORDER — ONDANSETRON HCL 4 MG PO TABS: 4.0000 mg | ORAL_TABLET | ORAL | Status: DC | PRN

## 2018-10-24 MED ORDER — FENTANYL 2.5 MCG/ML W/ROPIVACAINE 0.15% IN NS 100 ML EPIDURAL (ARMC)
EPIDURAL | Status: AC
  Filled 2018-10-24: qty 100

## 2018-10-24 MED ORDER — MEASLES, MUMPS & RUBELLA VAC IJ SOLR
0.5000 mL | Freq: Once | INTRAMUSCULAR | Status: DC
  Filled 2018-10-24: qty 0.5

## 2018-10-24 MED ORDER — PRENATAL MULTIVITAMIN CH
1.0000 | ORAL_TABLET | Freq: Every day | ORAL | Status: DC
  Administered 2018-10-24 – 2018-10-25 (×2): 1 via ORAL
  Filled 2018-10-24 (×3): qty 1

## 2018-10-24 MED ORDER — COCONUT OIL OIL: 1.0000 "application " | TOPICAL_OIL | Status: DC | PRN

## 2018-10-24 MED ORDER — SODIUM CHLORIDE 0.9 % IV SOLN: 250.0000 mL | INTRAVENOUS | Status: DC | PRN

## 2018-10-24 MED ORDER — IBUPROFEN 600 MG PO TABS
600.0000 mg | ORAL_TABLET | Freq: Four times a day (QID) | ORAL | Status: DC
  Administered 2018-10-24 – 2018-10-25 (×5): 600 mg via ORAL
  Filled 2018-10-24 (×5): qty 1

## 2018-10-24 NOTE — Lactation Note (Signed)
This note was copied from a baby's chart. Lactation Consultation Note  Patient Name: Nicole Boone KNZUD'O Date: 10/24/2018     Maternal Data    Feeding Feeding Type: Breast Milk(and formula) Nipple Type: Slow - flow  LATCH Score                   Interventions    Lactation Tools Discussed/Used     Consult Status  Mother was provided with a breast pump kit to initiate pumping. LC reviewed instructions on the use of the pump and encouraged mother to pump every 2-3 hours for 15 minutes. LC also encouraged lots of skin to skin with infant. Mother verbalized understanding of teachings.    Elvera Lennox 03/01/5000, 12:26 PM

## 2018-10-24 NOTE — Discharge Summary (Signed)
Obstetrical Discharge Summary  Patient Name: Nicole Boone DOB: 1991-02-01 MRN: [id number]  Date of Admission: 10/23/2018 Date of Discharge: 10/25/2018 Primary OB: [facility]  Gestational Age at Delivery: [redacted]w[redacted]d   Antepartum complications:  Obesity  Admitting Diagnosis: PPROM Secondary Diagnosis: Patient Active Problem List   Diagnosis Date Noted  . Preterm premature rupture of membranes, antepartum 35/32/9924    Complications: None Intrapartum complications/course:  26ST G2P0010 at 34+5wks presented in active labor with PPROM for clear fluid. Received 1 dose BMZ and 2g amicillin for GBS unknown, which was not repeated when GBS returned negative. Progressed to fully dilated, pushed over intact perineum, delivered fetal head with a loose nuchal cord. Promptly delivered shoulders and baby passed to maternal abdomen. She was vigorous and crying. After delayed cord clamping, FOB cut the cord. Placenta delivered spontaneously and intact. Shallow 2nd deg lac repaired in standard fashion. Bleeding minimal. Fundus firm. PPD pitocin running. Tolerated well.  Date of Delivery: 10/24/2018 Delivered By: Benjaman Kindler Delivery Type: spontaneous vaginal delivery Anesthesia: 10cc local 1%lidocaine Placenta: Spontaneous Laceration: 2nd deg Episiotomy: none Newborn Data: Live born female "Regean" Birth Weight: 5 lb 15.6 oz (2710 g) APGAR: 8, 9  Newborn Delivery   Birth date/time:  10/24/2018 06:55:00 Delivery type:  Vaginal, Spontaneous       Discharge Physical Exam:  BP 110/69 (BP Location: Right Arm)   Pulse 74   Temp 98.7 F (37.1 C) (Oral)   Resp 18   Ht 5\' 9"  (1.753 m)   Wt 104.3 kg   LMP 03/02/2018 (Exact Date)   SpO2 94%   Breastfeeding Unknown   BMI 33.97 kg/m   General: NAD CV: RRR Pulm: CTABL, nl effort ABD: s/nd/nt, fundus firm and below the umbilicus Lochia: moderate IDVT Evaluation: LE non-ttp, no evidence of DVT on exam.  Hemoglobin  Date Value  Ref Range Status  10/25/2018 11.3 (L) 12.0 - 15.0 g/dL Final   HCT  Date Value Ref Range Status  10/25/2018 33.5 (L) 36.0 - 46.0 % Final    Post partum course: uncomplicated  Postpartum Procedures: none Disposition: stable, discharge to home. Baby Feeding: breastmilk Baby Disposition: home with mom  Rh Immune globulin given: n/a Rubella vaccine given: n/a  Tdap vaccine given in AP or PP setting: 10/03/18 Given By [facility] Flu vaccine given in AP or PP setting: Given 06/07/18 DLC  Contraception: OCPs  Prenatal Labs: Blood type/Rh --/--/O POS (03/18 0031)  Antibody screen neg  Rubella Immune  Varicella Immune  RPR NR  HBsAg Neg  HIV NR  GC neg  Chlamydia neg  Genetic screening negative  1 hour GTT 170  3 hour GTT passed  GBS negative     Plan:  Encarnacion Chu was discharged to home in good condition. Follow-up appointment at [place] with delivering provider in 6 weeks   Discharge Medications: Allergies as of 10/25/2018      Reactions   Benadryl [diphenhydramine Hcl]       Medication List    STOP taking these medications   HYDROcodone-acetaminophen 5-325 MG tablet Commonly known as:  Norco   levonorgestrel-ethinyl estradiol 0.1-20 MG-MCG tablet Commonly known as:  AVIANE,ALESSE,LESSINA   metroNIDAZOLE 500 MG tablet Commonly known as:  FLAGYL     TAKE these medications   ibuprofen 600 MG tablet Commonly known as:  ADVIL,MOTRIN Take 1 tablet (600 mg total) by mouth every 6 (six) hours. What changed:    when to take this  reasons to take this  norethindrone 0.35 MG tablet Commonly known as:  Ortho Micronor Take 1 tablet (0.35 mg total) by mouth daily.   prenatal multivitamin Tabs tablet Take 1 tablet by mouth daily at 12 noon.       Follow-up Information    Benjaman Kindler, MD Follow up in 6 week(s).   Specialty:  Obstetrics and Gynecology Why:  pp care  Contact information: [address] [place]  [id number] [phone]           Signed: Laverta Baltimore MD

## 2018-10-25 LAB — RPR: RPR Ser Ql: NONREACTIVE

## 2018-10-25 LAB — CBC
HCT: 33.5 % — ABNORMAL LOW (ref 36.0–46.0)
Hemoglobin: 11.3 g/dL — ABNORMAL LOW (ref 12.0–15.0)
MCH: 30.4 pg (ref 26.0–34.0)
MCHC: 33.7 g/dL (ref 30.0–36.0)
MCV: 90.1 fL (ref 80.0–100.0)
Platelets: 255 10*3/uL (ref 150–400)
RBC: 3.72 MIL/uL — ABNORMAL LOW (ref 3.87–5.11)
RDW: 13.5 % (ref 11.5–15.5)
WBC: 16.1 10*3/uL — ABNORMAL HIGH (ref 4.0–10.5)
nRBC: 0 % (ref 0.0–0.2)

## 2018-10-25 MED ORDER — NORETHINDRONE 0.35 MG PO TABS: 1.0000 | ORAL_TABLET | Freq: Every day | ORAL | 11 refills | Status: DC

## 2018-10-25 MED ORDER — IBUPROFEN 600 MG PO TABS: 600.0000 mg | ORAL_TABLET | Freq: Four times a day (QID) | ORAL | 0 refills | Status: DC

## 2018-10-25 NOTE — Progress Notes (Signed)
Patient discharged home Discharge instructions, prescriptions and follow up appointment given to and reviewed with patient. Patient verbalized understanding. Infant in SCN

## 2018-10-25 NOTE — Lactation Note (Signed)
Lactation Consultation Note  Patient Name: Nicole Boone Date: 10/25/2018 Reason for consult: Initial assessment   Maternal Data  Mother is a first time breastfeeding mother.  Feeding  Mother has done some breast feeding and she has been pumping.  Interventions    Lactation Tools Discussed/Used Pump Review: Setup, frequency, and cleaning;Milk Storage   Consult Status   Ongoing   Daryel November 10/25/2018, 11:09 AM

## 2018-10-25 NOTE — Progress Notes (Signed)
   10/25/18 0900  Clinical Encounter Type  Visited With Patient  Visit Type Initial;Spiritual support  Referral From Chaplain  Consult/Referral To [place] was rounding and stop to visit patient. Patient is going home today and baby is staying. Chaplain offered emotional support and gave patient a prayer shawl and packet of cap and prayer cloth. Patient really appreciated Chaplain's prayer, she stated she wanted to go home and get some rest.

## 2018-10-26 LAB — SURGICAL PATHOLOGY

## 2018-10-30 ENCOUNTER — Ambulatory Visit: Payer: Self-pay

## 2018-10-30 NOTE — Lactation Note (Signed)
This note was copied from a baby's chart. Lactation Consultation Note  Patient Name: Nicole Boone QIWLN'L Date: 10/30/2018     Maternal Data    Feeding Feeding Type: Breast Milk  LATCH Score Latch: Repeated attempts needed to sustain latch, nipple held in mouth throughout feeding, stimulation needed to elicit sucking reflex.  Audible Swallowing: A few with stimulation  Type of Nipple: Everted at rest and after stimulation  Comfort (Breast/Nipple): Soft / non-tender  Hold (Positioning): Assistance needed to correctly position infant at breast and maintain latch.  LATCH Score: 7  Interventions Interventions: Assisted with latch;Adjust position;Support pillows  Lactation Tools Discussed/Used Tools: Nipple Shields;23F feeding tube / Syringe;Pump(curved tip syringe)   Consult Status Consult Status: PRN Mother assisted with latch and positioning of infant for breastfeeding. Mother states that this is her first time latching infant to the breast. Nipple shield was used and infant is in the football position. Curved tipped syringe of mother's breast milk was used because infant would not suck unless a constant flow was present. Mother was educated on how to pace infant during the breastfeeding session as well as how to position and use of the nipple shield.   Elvera Lennox 8/92/1194, 12:43 PM

## 2018-12-05 ENCOUNTER — Telehealth: Payer: Self-pay | Admitting: Obstetrics & Gynecology

## 2018-12-05 ENCOUNTER — Other Ambulatory Visit: Payer: Self-pay | Admitting: Obstetrics & Gynecology

## 2018-12-05 NOTE — Telephone Encounter (Signed)
paitent called on-call provider 10:30pm - delivered 6 weeks ago (3/18) and had routine bleeding, stopped 2 weeks ago.  started bleeding heavily yesterday and is now soaking though pads q57mins and passing hand-sized clots.  feels fatigued.   Chart review: -preterm delivery @ 34+5 -shallow 2nd degree laceration -O+ -breastfeeding, pumping - no formula -was Rx'd northindrone - did not ask if she was taking it.   sending to ED for evaluation - called charge nurse to notify.  -pelvic exam -ultrasound -cbc -may need intervention high-dose estrogen vs progesterone vs procedure (D&C)  ----- Larey Days, MD, [place] Attending Obstetrician and Gynecologist [facility], Department of [facility]

## 2018-12-06 ENCOUNTER — Emergency Department
Admission: EM | Admit: 2018-12-06 | Discharge: 2018-12-06 | Disposition: A | Discharge status: Home / Self Care | Payer: Managed Care, Other (non HMO) | Attending: Emergency Medicine | Admitting: Emergency Medicine

## 2018-12-06 ENCOUNTER — Other Ambulatory Visit: Payer: Self-pay

## 2018-12-06 ENCOUNTER — Encounter: Payer: Self-pay | Admitting: Emergency Medicine

## 2018-12-06 ENCOUNTER — Emergency Department: Payer: Managed Care, Other (non HMO)

## 2018-12-06 DIAGNOSIS — N939 Abnormal uterine and vaginal bleeding, unspecified: Secondary | ICD-10-CM | POA: Insufficient documentation

## 2018-12-06 LAB — URINALYSIS, COMPLETE (UACMP) WITH MICROSCOPIC
Bacteria, UA: NONE SEEN
Bilirubin Urine: NEGATIVE
Glucose, UA: NEGATIVE mg/dL
Ketones, ur: NEGATIVE mg/dL
Leukocytes,Ua: NEGATIVE
Nitrite: NEGATIVE
Protein, ur: NEGATIVE mg/dL
RBC / HPF: >50 RBC/hpf — ABNORMAL HIGH (ref 0–5)
Specific Gravity, Urine: 1.024 (ref 1.005–1.030)
Squamous Epithelial / HPF: NONE SEEN (ref 0–5)
pH: 5 (ref 5.0–8.0)

## 2018-12-06 LAB — COMPREHENSIVE METABOLIC PANEL
ALT: 14 U/L (ref 0–44)
AST: 16 U/L (ref 15–41)
Albumin: 4 g/dL (ref 3.5–5.0)
Alkaline Phosphatase: 87 U/L (ref 38–126)
Anion gap: 9 (ref 5–15)
BUN: 7 mg/dL (ref 6–20)
CO2: 21 mmol/L — ABNORMAL LOW (ref 22–32)
Calcium: 8.5 mg/dL — ABNORMAL LOW (ref 8.9–10.3)
Chloride: 110 mmol/L (ref 98–111)
Creatinine, Ser: 0.79 mg/dL (ref 0.44–1.00)
GFR calc Af Amer: >60 mL/min (ref >60)
GFR calc non Af Amer: >60 mL/min (ref >60)
Glucose, Bld: 119 mg/dL — ABNORMAL HIGH (ref 70–99)
Potassium: 3.6 mmol/L (ref 3.5–5.1)
Sodium: 140 mmol/L (ref 135–145)
Total Bilirubin: 0.5 mg/dL (ref 0.3–1.2)
Total Protein: 7 g/dL (ref 6.5–8.1)

## 2018-12-06 LAB — CBC WITH DIFFERENTIAL/PLATELET
Abs Immature Granulocytes: 0.03 10*3/uL (ref 0.00–0.07)
Basophils Absolute: 0.1 10*3/uL (ref 0.0–0.1)
Basophils Relative: 1 %
Eosinophils Absolute: 0.8 10*3/uL — ABNORMAL HIGH (ref 0.0–0.5)
Eosinophils Relative: 7 %
HCT: 44.6 % (ref 36.0–46.0)
Hemoglobin: 14.8 g/dL (ref 12.0–15.0)
Immature Granulocytes: 0 %
Lymphocytes Relative: 44 %
Lymphs Abs: 5.7 10*3/uL — ABNORMAL HIGH (ref 0.7–4.0)
MCH: 29.4 pg (ref 26.0–34.0)
MCHC: 33.2 g/dL (ref 30.0–36.0)
MCV: 88.7 fL (ref 80.0–100.0)
Monocytes Absolute: 0.8 10*3/uL (ref 0.1–1.0)
Monocytes Relative: 7 %
Neutro Abs: 5.2 10*3/uL (ref 1.7–7.7)
Neutrophils Relative %: 41 %
Platelets: 296 10*3/uL (ref 150–400)
RBC: 5.03 MIL/uL (ref 3.87–5.11)
RDW: 13.2 % (ref 11.5–15.5)
WBC: 12.7 10*3/uL — ABNORMAL HIGH (ref 4.0–10.5)
nRBC: 0 % (ref 0.0–0.2)

## 2018-12-06 LAB — POCT PREGNANCY, URINE: Preg Test, Ur: NEGATIVE

## 2018-12-06 NOTE — Discharge Instructions (Addendum)
As we discussed, your lab work and ultrasound were reassuring today.  It is likely you are simply starting back on your period which is heavier than normal.  Please call Dr. Migdalia Dk office to schedule a follow-up appointment.  Return to the emergency department if you develop new or worsening symptoms that concern you.

## 2018-12-06 NOTE — ED Provider Notes (Signed)
[facility] Emergency Department Provider Note  ____________________________________________   First MD Initiated Contact with Patient 12/06/18 [phone]     (approximate)  I have reviewed the triage vital signs and the nursing notes.   HISTORY  Chief Complaint Vaginal Bleeding    HPI Nicole Boone is a 28 y.o. female G1, P1 who is about 6 weeks postpartum.  She presents tonight for evaluation of heavy vaginal bleeding with large clots.  She states that she had a normal vaginal delivery with no complications.  She had mild persistent vaginal bleeding for about 4 weeks and then got better.  A couple of days ago she started having heavy vaginal bleeding and has been passing large clots, some the size of her fist.  She has some mild lower abdominal cramping consistent with menstrual period.  Given the heaviness of the bleeding, she called Dr. Migdalia Dk office and spoke with Dr. Leonides Schanz who recommended she come to the ED for evaluation.  The patient denies dizziness, chest pain, shortness of breath, fever, nausea, vomiting, and any other abdominal pain.  Nothing particular makes his symptoms better or worse.  She is going through about a pad an hour.         Past Medical History:  Diagnosis Date  . Abnormal uterine bleeding   . Broken arm 2007   left  . Dysmenorrhea     Patient Active Problem List   Diagnosis Date Noted  . Preterm premature rupture of membranes, antepartum 10/23/2018    Past Surgical History:  Procedure Laterality Date  . COLONOSCOPY WITH PROPOFOL N/A 06/15/2015   Procedure: COLONOSCOPY WITH PROPOFOL;  Surgeon: Josefine Class, MD;  Location: [facility] ENDOSCOPY;  Service: Endoscopy;  Laterality: N/A;    Prior to Admission medications   Medication Sig Start Date End Date Taking? Authorizing Provider  ibuprofen (ADVIL,MOTRIN) 600 MG tablet Take 1 tablet (600 mg total) by mouth every 6 (six) hours. 10/25/18   Schermerhorn, Gwen Her, MD   norethindrone (ORTHO MICRONOR) 0.35 MG tablet Take 1 tablet (0.35 mg total) by mouth daily. 10/25/18 10/25/19  Schermerhorn, Gwen Her, MD  Prenatal Vit-Fe Fumarate-FA (PRENATAL MULTIVITAMIN) TABS tablet Take 1 tablet by mouth daily at 12 noon.    [provider]    Allergies Benadryl [diphenhydramine hcl]  No family history on file.  Social History Social History   Tobacco Use  . Smoking status: Never Smoker  . Smokeless tobacco: Never Used  Substance Use Topics  . Alcohol use: Yes    Alcohol/week: 1.0 standard drinks    Types: 1 Glasses of wine per week  . Drug use: No    Review of Systems Constitutional: No fever/chills Eyes: No visual changes. ENT: No sore throat. Cardiovascular: Denies chest pain. Respiratory: Denies shortness of breath. Gastrointestinal: Mild lower abdominal cramps consistent with menstrual period.  No nausea nor vomiting. Genitourinary: Heavy vaginal bleeding with clots about 6 weeks postpartum. Musculoskeletal: Negative for neck pain.  Negative for back pain. Integumentary: Negative for rash. Neurological: Negative for headaches, focal weakness or numbness.   ____________________________________________   PHYSICAL EXAM:  VITAL SIGNS: ED Triage Vitals [12/06/18 0058]  Enc Vitals Group     BP 126/80     Pulse Rate 94     Resp 18     Temp 98.1 F (36.7 C)     Temp Source Oral     SpO2 97 %     Weight 88.5 kg (195 lb)     Height  1.753 m (5\' 9" )     Head Circumference      Peak Flow      Pain Score 5     Pain Loc      Pain Edu?      Excl. in [place]?     Constitutional: Alert and oriented. Well appearing and in no acute distress. Eyes: Conjunctivae are normal.  Head: Atraumatic. Nose: No congestion/rhinnorhea. Mouth/Throat: Mucous membranes are moist. Neck: No stridor.  No meningeal signs.   Cardiovascular: Normal rate, regular rhythm. Good peripheral circulation. Grossly normal heart sounds. Respiratory: Normal respiratory  effort.  No retractions. No audible wheezing. Gastrointestinal: Soft and nondistended.  Minimal tenderness to palpation of the lower abdomen with no rebound and no guarding.  No evidence of peritonitis Genitourinary: Deferred pelvic exam based on discussion between patient and myself. Musculoskeletal: No lower extremity tenderness nor edema. No gross deformities of extremities. Neurologic:  Normal speech and language. No gross focal neurologic deficits are appreciated.  Skin:  Skin is warm, dry and intact. No rash noted. Psychiatric: Mood and affect are normal. Speech and behavior are normal.  ____________________________________________   LABS (all labs ordered are listed, but only abnormal results are displayed)  Labs Reviewed  CBC WITH DIFFERENTIAL/PLATELET - Abnormal; Notable for the following components:      Result Value   WBC 12.7 (*)    Lymphs Abs 5.7 (*)    Eosinophils Absolute 0.8 (*)    All other components within normal limits  COMPREHENSIVE METABOLIC PANEL - Abnormal; Notable for the following components:   CO2 21 (*)    Glucose, Bld 119 (*)    Calcium 8.5 (*)    All other components within normal limits  URINALYSIS, COMPLETE (UACMP) WITH MICROSCOPIC - Abnormal; Notable for the following components:   Color, Urine YELLOW (*)    APPearance HAZY (*)    Hgb urine dipstick LARGE (*)    RBC / HPF >50 (*)    All other components within normal limits  POCT PREGNANCY, URINE   ____________________________________________  EKG  None - EKG not ordered by ED physician ____________________________________________  RADIOLOGY   ED MD interpretation: No acute or emergent abnormal findings on pelvic ultrasound with a normal endometrial stripe and no associated vascularity to suggest retained products.  Official radiology report(s): US Pelvis Complete  Result Date: 12/06/2018 CLINICAL DATA:  Initial evaluation for heavy vaginal bleeding, 6 weeks postpartum. EXAM:  TRANSABDOMINAL ULTRASOUND OF PELVIS TECHNIQUE: Transabdominal ultrasound examination of the pelvis was performed including evaluation of the uterus, ovaries, adnexal regions, and pelvic cul-de-sac. COMPARISON:  None available. FINDINGS: Uterus Measurements: 8.1 x 4.5 x 5.1 cm = volume: 97.0 mL. No fibroids or other mass visualized. Endometrium Thickness: 4.9 mm.  No focal abnormality visualized. Right ovary Measurements: 2.4 x 1.6 x 1.6 cm = volume: 3.2 mL. Normal appearance/no adnexal mass. Left ovary Measurements: 3.0 x 2.1 x 2.4 cm = volume: 8.2 mL. Normal appearance/no adnexal mass. Other findings:  No abnormal free fluid. IMPRESSION: Normal pelvic ultrasound. Specifically, endometrial stripe is normal in appearance measuring 5 mm in thickness without associated vascularity to suggest retained products. Electronically Signed   By: Jeannine Boga M.D.   On: 12/06/2018 02:14    ____________________________________________   PROCEDURES   Procedure(s) performed (including Critical Care):  Procedures   ____________________________________________   INITIAL IMPRESSION / MDM / [place] / ED COURSE  As part of my medical decision making, I reviewed the following data within the electronic medical  record:  Nursing notes reviewed and incorporated, Labs reviewed , Old chart reviewed, Discussed by phone with Dr. Leonides Schanz (OB/GYN) and Notes from prior ED visits      MAKINZI PRIEUR was evaluated in Emergency Department on 12/06/2018 for the symptoms described in the history of present illness. She was evaluated in the context of the global COVID-19 pandemic, which necessitated consideration that the patient might be at risk for infection with the SARS-CoV-2 virus that causes COVID-19. Institutional protocols and algorithms that pertain to the evaluation of patients at risk for COVID-19 are in a state of rapid change based on information released by regulatory bodies including the CDC and  federal and state organizations. These policies and algorithms were followed during the patient's care in the ED.  Differential diagnosis includes, but is not limited to, postpartum menorrhagia, retained products of conception, endometritis.  The patient is well-appearing and in no distress with stable vital signs.  Blood work is all within normal limits including no evidence of anemia.  She is not symptomatic from her blood loss.  The patient would prefer not to have a pelvic exam and I agreed that it is not required at this time.  Her ultrasound is reassuring with no signs of retained products of conception and she has no sign of acute infection.  I called and spoke by phone with Dr. Leonides Schanz and went over the case and she agreed that outpatient follow-up sounded appropriate.  The patient is comfortable with this plan and was able to ambulate upon discharge without any difficulty.  I gave my usual customary return precautions.      ____________________________________________  FINAL CLINICAL IMPRESSION(S) / ED DIAGNOSES  Final diagnoses:  Vaginal bleeding     MEDICATIONS GIVEN DURING THIS VISIT:  Medications - No data to display   ED Discharge Orders    None       Note:  This document was prepared using Dragon voice recognition software and may include unintentional dictation errors.   Hinda Kehr, MD 12/06/18 [phone]

## 2018-12-06 NOTE — ED Triage Notes (Addendum)
Patient ambulatory to triage with steady gait, without difficulty or distress noted; pt st 6wks postpartum, vag delivery (Dr Leafy Ro), began having heavy vag bleeding yesterday with clots and lower abd/vag pain; currently breastfeeding

## 2020-07-20 ENCOUNTER — Other Ambulatory Visit: Payer: Self-pay | Admitting: Obstetrics and Gynecology

## 2020-08-11 NOTE — H&P (View-Only) (Signed)
Nicole Boone is a 30 y.o. female presenting with Pre Op Consulting   History of Present Illness: Patient returns for a pre-operative visit for TLH with BS. Indications for surgery: dysmenorrhea, new onset menorrhagia, pt desires definitive management, s/p failed hormonal manipulation. Conservative measures have been discussed with pt, and recommended first. Pt states she is done with child bearing.  Workup: Pap 07/2016: neg/neg TVUS 07/2020: Ut wnl retroverted Endometrium=4.90 mm bil ovs wnl   Past Medical History:  has a past medical history of Abnormal uterine bleeding, Migraine without aura and without status migrainosus, not intractable (09/14/2017), and Pernicious anemia (09/14/2017).  Past Surgical History:  has a past surgical history that includes other surgery (Right, 2007); left arm (Left, 2007); and Colonoscopy (06/15/2015). Family History: family history includes Diabetes type II in her maternal grandfather; High blood pressure (Hypertension) in her maternal grandfather; Migraines in her brother and sister; Myocardial Infarction (Heart attack) in her maternal grandfather; No Known Problems in her father and mother. Social History:  reports that she has never smoked. She has never used smokeless tobacco. She reports that she does not drink alcohol and does not use drugs. OB/GYN History:          OB History    Gravida  1   Para  1   Term  0   Preterm  1   AB  0   Living  1     SAB  0   IAB  0   Ectopic  0   Molar      Multiple  0   Live Births  1        Allergies: is allergic to benadryl [diphenhydramine hcl]. Medications: Current Outpatient Medications:  .  albuterol 90 mcg/actuation inhaler, Inhale 2 inhalations into the lungs every 6 (six) hours as needed for Wheezing or Shortness of Breath, Disp: 1 Inhaler, Rfl: 0 .  azithromycin (ZITHROMAX) 250 MG tablet, , Disp: , Rfl:  .  cholecalciferol (CHOLECALCIFEROL) 1,000 unit tablet, Take by mouth.  (Patient not taking: Reported on 07/14/2020  ), Disp: , Rfl:  .  norethindrone (MICRONOR) 0.35 mg tablet, Take by mouth, Disp: , Rfl:  .  predniSONE (DELTASONE) 20 MG tablet, , Disp: , Rfl:  .  prenatal vitamin-iron-FA-DHA-docusate (PRENEXA, FOLCAL DHA) capsule, Take 1 capsule by mouth once daily (Patient not taking: Reported on 07/14/2020  ), Disp: , Rfl:   Current Facility-Administered Medications:  .  cyanocobalamin (VITAMIN B12) injection 1,000 mcg, 1,000 mcg, Intramuscular, Q14 Days, Yevonne Pax, MD, 1,000 mcg at 10/15/18 1532  Review of Systems: No SOB, no palpitations or chest pain, no new lower extremity edema, no nausea or vomiting or bowel or bladder complaints. See HPI for gyn specific ROS.   Exam:   BP 122/86   Ht 175.3 cm (5\' 9" )   Wt (!) 107.1 kg (236 lb 3.2 oz)   LMP 08/02/2020   Breastfeeding No   BMI 34.88 kg/m   Constitutional:  General appearance: Well nourished, well developed female in no acute distress.  Neuro/psych:  Normal mood and affect. No gross motor deficits. Neck:  Supple, normal appearance.  Respiratory:  Normal respiratory effort, no use of accessory muscles Skin:  No visible rashes or external lesions  Impression:   The primary encounter diagnosis was Preoperative clearance. Diagnoses of Excessive or frequent menstruation and Primary dysmenorrhea were also pertinent to this visit.  Plan:   1. -Patient returns for a preoperative discussion regarding her plans to proceed with  surgical treatment of her  by total laparoscopic hysterectomy with bilateral salpingectomy  procedure. We may perform a cystoscopy to evaluate the urinary tract after the procedure, if surgically indicated for uro tract integrity.   The patient and I discussed the technical aspects of the procedure including the potential for risks and complications. These include but are not limited to the risk of infection requiring post-operative antibiotics or further  procedures. We talked about the risk of injury to adjacent organs including bladder, bowel, ureter, blood vessels or nerves. We talked about the need to convert to an open incision. We talked about the possible need for blood transfusion. We talked aboutpostop complications such asthromboembolic or cardiopulmonary complications. All of her questions were answered.  Her preoperative exam was completed and the appropriate consents were signed. She is scheduled to undergo this procedure in the near future.  Specific Peri-operative Considerations:  - Consent: obtained today - Health Maintenance: up to date - Labs: CBC, CMP preoperatively - Studies: EKG, CXR preoperatively - Bowel Preparation: None required - Abx:  Ancef 2g - VTE ppx: SCDs perioperatively - Glucose Protocol: na - Beta-blockade: na  Diagnoses and all orders for this visit:  Preoperative clearance  Excessive or frequent menstruation  Primary dysmenorrhea

## 2020-08-11 NOTE — H&P (Signed)
Nicole Boone is a 30 y.o. female presenting with Pre Op Consulting   History of Present Illness: Patient returns for a pre-operative visit for TLH with BS. Indications for surgery: dysmenorrhea, new onset menorrhagia, pt desires definitive management, s/p failed hormonal manipulation. Conservative measures have been discussed with pt, and recommended first. Pt states she is done with child bearing.  Workup: Pap 07/2016: neg/neg TVUS 07/2020: Ut wnl retroverted Endometrium=4.90 mm bil ovs wnl   Past Medical History:  has a past medical history of Abnormal uterine bleeding, Migraine without aura and without status migrainosus, not intractable (09/14/2017), and Pernicious anemia (09/14/2017).  Past Surgical History:  has a past surgical history that includes other surgery (Right, 2007); left arm (Left, 2007); and Colonoscopy (06/15/2015). Family History: family history includes Diabetes type II in her maternal grandfather; High blood pressure (Hypertension) in her maternal grandfather; Migraines in her brother and sister; Myocardial Infarction (Heart attack) in her maternal grandfather; No Known Problems in her father and mother. Social History:  reports that she has never smoked. She has never used smokeless tobacco. She reports that she does not drink alcohol and does not use drugs. OB/GYN History:          OB History    Gravida  1   Para  1   Term  0   Preterm  1   AB  0   Living  1     SAB  0   IAB  0   Ectopic  0   Molar      Multiple  0   Live Births  1        Allergies: is allergic to benadryl [diphenhydramine hcl]. Medications: Current Outpatient Medications:  .  albuterol 90 mcg/actuation inhaler, Inhale 2 inhalations into the lungs every 6 (six) hours as needed for Wheezing or Shortness of Breath, Disp: 1 Inhaler, Rfl: 0 .  azithromycin (ZITHROMAX) 250 MG tablet, , Disp: , Rfl:  .  cholecalciferol (CHOLECALCIFEROL) 1,000 unit tablet, Take by mouth.  (Patient not taking: Reported on 07/14/2020  ), Disp: , Rfl:  .  norethindrone (MICRONOR) 0.35 mg tablet, Take by mouth, Disp: , Rfl:  .  predniSONE (DELTASONE) 20 MG tablet, , Disp: , Rfl:  .  prenatal vitamin-iron-FA-DHA-docusate (PRENEXA, FOLCAL DHA) capsule, Take 1 capsule by mouth once daily (Patient not taking: Reported on 07/14/2020  ), Disp: , Rfl:   Current Facility-Administered Medications:  .  cyanocobalamin (VITAMIN B12) injection 1,000 mcg, 1,000 mcg, Intramuscular, Q14 Days, Yevonne Pax, MD, 1,000 mcg at 10/15/18 1532  Review of Systems: No SOB, no palpitations or chest pain, no new lower extremity edema, no nausea or vomiting or bowel or bladder complaints. See HPI for gyn specific ROS.   Exam:   BP 122/86   Ht 175.3 cm (5\' 9" )   Wt (!) 107.1 kg (236 lb 3.2 oz)   LMP 08/02/2020   Breastfeeding No   BMI 34.88 kg/m   Constitutional:  General appearance: Well nourished, well developed female in no acute distress.  Neuro/psych:  Normal mood and affect. No gross motor deficits. Neck:  Supple, normal appearance.  Respiratory:  Normal respiratory effort, no use of accessory muscles Skin:  No visible rashes or external lesions  Impression:   The primary encounter diagnosis was Preoperative clearance. Diagnoses of Excessive or frequent menstruation and Primary dysmenorrhea were also pertinent to this visit.  Plan:   1. -Patient returns for a preoperative discussion regarding her plans to proceed with  surgical treatment of her  by total laparoscopic hysterectomy with bilateral salpingectomy  procedure. We may perform a cystoscopy to evaluate the urinary tract after the procedure, if surgically indicated for uro tract integrity.   The patient and I discussed the technical aspects of the procedure including the potential for risks and complications. These include but are not limited to the risk of infection requiring post-operative antibiotics or further  procedures. We talked about the risk of injury to adjacent organs including bladder, bowel, ureter, blood vessels or nerves. We talked about the need to convert to an open incision. We talked about the possible need for blood transfusion. We talked aboutpostop complications such asthromboembolic or cardiopulmonary complications. All of her questions were answered.  Her preoperative exam was completed and the appropriate consents were signed. She is scheduled to undergo this procedure in the near future.  Specific Peri-operative Considerations:  - Consent: obtained today - Health Maintenance: up to date - Labs: CBC, CMP preoperatively - Studies: EKG, CXR preoperatively - Bowel Preparation: None required - Abx:  Ancef 2g - VTE ppx: SCDs perioperatively - Glucose Protocol: na - Beta-blockade: na  Diagnoses and all orders for this visit:  Preoperative clearance  Excessive or frequent menstruation  Primary dysmenorrhea

## 2020-08-17 ENCOUNTER — Encounter
Admission: RE | Admit: 2020-08-17 | Discharge: 2020-08-17 | Disposition: A | Discharge status: Home / Self Care | Payer: BC Managed Care – PPO | Source: Ambulatory Visit | Attending: Obstetrics and Gynecology | Admitting: Obstetrics and Gynecology

## 2020-08-17 ENCOUNTER — Other Ambulatory Visit: Payer: Self-pay

## 2020-08-17 HISTORY — DX: Headache, unspecified: R51.9

## 2020-08-17 NOTE — Patient Instructions (Signed)
Your procedure is scheduled on:08-24-20 MONDAY Report to the Registration Desk on the 1st floor of the [facility]-Then proceed to the 2nd floor Surgery Desk in the [place] To find out your arrival time, please call [phone] between 1PM - 3PM on:08-21-20 FRIDAY  REMEMBER: Instructions that are not followed completely may result in serious medical risk, up to and including death; or upon the discretion of your surgeon and anesthesiologist your surgery may need to be rescheduled.  Do not eat food after midnight the night before surgery.  No gum chewing, lozengers or hard candies.  You may however, drink CLEAR liquids up to 2 hours before you are scheduled to arrive for your surgery. Do not drink anything within 2 hours of your scheduled arrival time.  Clear liquids include: - water  - apple juice without pulp - gatorade (not RED) - black coffee or tea (Do NOT add milk or creamers to the coffee or tea) Do NOT drink anything that is not on this list.  TAKE THESE MEDICATIONS THE MORNING OF SURGERY WITH A SIP OF WATER: -NONE  One week prior to surgery: Stop Anti-inflammatories (NSAIDS) such as Advil, Aleve, Ibuprofen, Motrin, Naproxen, Naprosyn and Aspirin based products such as Excedrin, Goodys Powder, BC Powder-OK TO TAKE TYLENOL IF NEEDED  Stop ANY OVER THE COUNTER supplements until after surgery.  No Alcohol for 24 hours before or after surgery.  No Smoking including e-cigarettes for 24 hours prior to surgery.  No chewable tobacco products for at least 6 hours prior to surgery.  No nicotine patches on the day of surgery.  Do not use any "recreational" drugs for at least a week prior to your surgery.  Please be advised that the combination of cocaine and anesthesia may have negative outcomes, up to and including death. If you test positive for cocaine, your surgery will be cancelled.  On the morning of surgery brush your teeth with toothpaste and water, you may rinse your  mouth with mouthwash if you wish. Do not swallow any toothpaste or mouthwash.  Do not wear jewelry, make-up, hairpins, clips or nail polish.  Do not wear lotions, powders, or perfumes.   Do not shave body from the neck down 48 hours prior to surgery just in case you cut yourself which could leave a site for infection.  Also, freshly shaved skin may become irritated if using the CHG soap.  Contact lenses, hearing aids and dentures may not be worn into surgery.  Do not bring valuables to the hospital. [facility] is not responsible for any missing/lost belongings or valuables.   Use CHG Soap as directed on instruction sheet.  Notify your doctor if there is any change in your medical condition (cold, fever, infection).  Wear comfortable clothing (specific to your surgery type) to the hospital.  Plan for stool softeners for home use; pain medications have a tendency to cause constipation. You can also help prevent constipation by eating foods high in fiber such as fruits and vegetables and drinking plenty of fluids as your diet allows.  After surgery, you can help prevent lung complications by doing breathing exercises.  Take deep breaths and cough every 1-2 hours. Your doctor may order a device called an Incentive Spirometer to help you take deep breaths. When coughing or sneezing, hold a pillow firmly against your incision with both hands. This is called "splinting." Doing this helps protect your incision. It also decreases belly discomfort.  If you are being admitted to the hospital  overnight, leave your suitcase in the car. After surgery it may be brought to your room.  If you are being discharged the day of surgery, you will not be allowed to drive home. You will need a responsible adult (18 years or older) to drive you home and stay with you that night.   If you are taking public transportation, you will need to have a responsible adult (18 years or older) with you. Please confirm  with your physician that it is acceptable to use public transportation.   Please call the [facility] at [phone] if you have any questions about these instructions.  Visitation Policy:  Patients undergoing a surgery or procedure may have one family member or support person with them as long as that person is not COVID-19 positive or experiencing its symptoms.  That person may remain in the waiting area during the procedure.  Inpatient Visitation:    Visiting hours are 7 a.m. to 8 p.m. Patients will be allowed one visitor. The visitor may change daily. The visitor must pass COVID-19 screenings, use hand sanitizer when entering and exiting the patient's room and wear a mask at all times, including in the patient's room. Patients must also wear a mask when staff or their visitor are in the room. Masking is required regardless of vaccination status. Systemwide, no visitors 17 or younger.

## 2020-08-20 ENCOUNTER — Other Ambulatory Visit: Payer: BC Managed Care – PPO

## 2020-08-20 ENCOUNTER — Encounter
Admission: RE | Admit: 2020-08-20 | Discharge: 2020-08-20 | Disposition: A | Discharge status: Home / Self Care | Payer: BC Managed Care – PPO | Source: Ambulatory Visit | Attending: Obstetrics and Gynecology | Admitting: Obstetrics and Gynecology

## 2020-08-20 ENCOUNTER — Other Ambulatory Visit: Payer: Self-pay

## 2020-08-20 DIAGNOSIS — Z01812 Encounter for preprocedural laboratory examination: Secondary | ICD-10-CM | POA: Insufficient documentation

## 2020-08-20 DIAGNOSIS — Z20822 Contact with and (suspected) exposure to covid-19: Secondary | ICD-10-CM | POA: Diagnosis not present

## 2020-08-20 LAB — CBC
HCT: 43.6 % (ref 36.0–46.0)
Hemoglobin: 13.7 g/dL (ref 12.0–15.0)
MCH: 24.1 pg — ABNORMAL LOW (ref 26.0–34.0)
MCHC: 31.4 g/dL (ref 30.0–36.0)
MCV: 76.8 fL — ABNORMAL LOW (ref 80.0–100.0)
Platelets: 322 10*3/uL (ref 150–400)
RBC: 5.68 MIL/uL — ABNORMAL HIGH (ref 3.87–5.11)
RDW: 16.1 % — ABNORMAL HIGH (ref 11.5–15.5)
WBC: 9.7 10*3/uL (ref 4.0–10.5)
nRBC: 0 % (ref 0.0–0.2)

## 2020-08-20 LAB — BASIC METABOLIC PANEL
Anion gap: 11 (ref 5–15)
BUN: 8 mg/dL (ref 6–20)
CO2: 21 mmol/L — ABNORMAL LOW (ref 22–32)
Calcium: 8.9 mg/dL (ref 8.9–10.3)
Chloride: 106 mmol/L (ref 98–111)
Creatinine, Ser: 0.74 mg/dL (ref 0.44–1.00)
GFR, Estimated: >60 mL/min (ref >60)
Glucose, Bld: 95 mg/dL (ref 70–99)
Potassium: 3.7 mmol/L (ref 3.5–5.1)
Sodium: 138 mmol/L (ref 135–145)

## 2020-08-20 LAB — TYPE AND SCREEN
ABO/RH(D): O POS
Antibody Screen: NEGATIVE

## 2020-08-20 LAB — SARS CORONAVIRUS 2 (TAT 6-24 HRS): SARS Coronavirus 2: NEGATIVE

## 2020-08-23 NOTE — OR Nursing (Signed)
Pt was contacted regarding postponed surgery. Pt was made aware she will be contacted by Surgeons office regarding alternate date for procedure.

## 2020-08-24 ENCOUNTER — Encounter: Admission: RE | Payer: Self-pay | Source: Home / Self Care

## 2020-08-24 ENCOUNTER — Ambulatory Visit
Admission: RE | Admit: 2020-08-24 | Payer: BC Managed Care – PPO | Source: Home / Self Care | Admitting: Obstetrics and Gynecology

## 2020-08-24 SURGERY — HYSTERECTOMY, TOTAL, LAPAROSCOPIC, WITH SALPINGECTOMY
Anesthesia: Choice | Laterality: Bilateral

## 2020-08-28 ENCOUNTER — Other Ambulatory Visit: Payer: Self-pay

## 2020-08-28 ENCOUNTER — Other Ambulatory Visit
Admission: RE | Admit: 2020-08-28 | Discharge: 2020-08-28 | Disposition: A | Discharge status: Home / Self Care | Payer: BC Managed Care – PPO | Source: Ambulatory Visit | Attending: Obstetrics and Gynecology | Admitting: Obstetrics and Gynecology

## 2020-08-28 DIAGNOSIS — N939 Abnormal uterine and vaginal bleeding, unspecified: Secondary | ICD-10-CM | POA: Diagnosis not present

## 2020-08-28 DIAGNOSIS — Z20822 Contact with and (suspected) exposure to covid-19: Secondary | ICD-10-CM | POA: Insufficient documentation

## 2020-08-28 DIAGNOSIS — N736 Female pelvic peritoneal adhesions (postinfective): Secondary | ICD-10-CM | POA: Diagnosis not present

## 2020-08-28 DIAGNOSIS — Z79899 Other long term (current) drug therapy: Secondary | ICD-10-CM | POA: Diagnosis not present

## 2020-08-28 DIAGNOSIS — N946 Dysmenorrhea, unspecified: Secondary | ICD-10-CM | POA: Diagnosis not present

## 2020-08-28 DIAGNOSIS — G9782 Other postprocedural complications and disorders of nervous system: Secondary | ICD-10-CM | POA: Diagnosis not present

## 2020-08-28 DIAGNOSIS — Z01812 Encounter for preprocedural laboratory examination: Secondary | ICD-10-CM | POA: Insufficient documentation

## 2020-08-28 DIAGNOSIS — N858 Other specified noninflammatory disorders of uterus: Secondary | ICD-10-CM | POA: Diagnosis not present

## 2020-08-28 DIAGNOSIS — Y838 Other surgical procedures as the cause of abnormal reaction of the patient, or of later complication, without mention of misadventure at the time of the procedure: Secondary | ICD-10-CM | POA: Diagnosis not present

## 2020-08-28 DIAGNOSIS — N83291 Other ovarian cyst, right side: Secondary | ICD-10-CM | POA: Diagnosis not present

## 2020-08-28 DIAGNOSIS — Z888 Allergy status to other drugs, medicaments and biological substances status: Secondary | ICD-10-CM | POA: Diagnosis not present

## 2020-08-28 LAB — SARS CORONAVIRUS 2 (TAT 6-24 HRS): SARS Coronavirus 2: NEGATIVE

## 2020-08-31 ENCOUNTER — Encounter
Admission: RE | Disposition: A | Discharge status: Home / Self Care | Payer: Self-pay | Source: Home / Self Care | Attending: Obstetrics and Gynecology

## 2020-08-31 ENCOUNTER — Ambulatory Visit: Payer: BC Managed Care – PPO

## 2020-08-31 ENCOUNTER — Ambulatory Visit
Admission: RE | Admit: 2020-08-31 | Discharge: 2020-08-31 | Disposition: A | Discharge status: Home / Self Care | Payer: BC Managed Care – PPO | Attending: Obstetrics and Gynecology | Admitting: Obstetrics and Gynecology

## 2020-08-31 ENCOUNTER — Ambulatory Visit: Payer: BC Managed Care – PPO | Admitting: Certified Registered Nurse Anesthetist

## 2020-08-31 ENCOUNTER — Encounter: Payer: Self-pay | Admitting: Obstetrics and Gynecology

## 2020-08-31 DIAGNOSIS — N946 Dysmenorrhea, unspecified: Secondary | ICD-10-CM | POA: Insufficient documentation

## 2020-08-31 DIAGNOSIS — Z888 Allergy status to other drugs, medicaments and biological substances status: Secondary | ICD-10-CM | POA: Insufficient documentation

## 2020-08-31 DIAGNOSIS — Z20822 Contact with and (suspected) exposure to covid-19: Secondary | ICD-10-CM | POA: Insufficient documentation

## 2020-08-31 DIAGNOSIS — Z79899 Other long term (current) drug therapy: Secondary | ICD-10-CM | POA: Insufficient documentation

## 2020-08-31 DIAGNOSIS — N858 Other specified noninflammatory disorders of uterus: Secondary | ICD-10-CM | POA: Insufficient documentation

## 2020-08-31 DIAGNOSIS — G9782 Other postprocedural complications and disorders of nervous system: Secondary | ICD-10-CM | POA: Insufficient documentation

## 2020-08-31 DIAGNOSIS — N939 Abnormal uterine and vaginal bleeding, unspecified: Secondary | ICD-10-CM | POA: Insufficient documentation

## 2020-08-31 DIAGNOSIS — N736 Female pelvic peritoneal adhesions (postinfective): Secondary | ICD-10-CM | POA: Insufficient documentation

## 2020-08-31 DIAGNOSIS — N83291 Other ovarian cyst, right side: Secondary | ICD-10-CM | POA: Insufficient documentation

## 2020-08-31 DIAGNOSIS — Y838 Other surgical procedures as the cause of abnormal reaction of the patient, or of later complication, without mention of misadventure at the time of the procedure: Secondary | ICD-10-CM | POA: Insufficient documentation

## 2020-08-31 HISTORY — PX: TOTAL LAPAROSCOPIC HYSTERECTOMY WITH SALPINGECTOMY: SHX6742

## 2020-08-31 HISTORY — PX: SUTURE REMOVAL: SHX6354

## 2020-08-31 LAB — POCT PREGNANCY, URINE: Preg Test, Ur: NEGATIVE

## 2020-08-31 SURGERY — HYSTERECTOMY, TOTAL, LAPAROSCOPIC, WITH SALPINGECTOMY
Anesthesia: General | Laterality: Bilateral

## 2020-08-31 SURGERY — REMOVAL, SUTURE
Anesthesia: General

## 2020-08-31 MED ORDER — LACTATED RINGERS IV SOLN: INTRAVENOUS | Status: DC

## 2020-08-31 MED ORDER — FENTANYL CITRATE (PF) 100 MCG/2ML IJ SOLN
INTRAMUSCULAR | Status: DC | PRN
  Administered 2020-08-31 (×2): 50 ug via INTRAVENOUS
  Administered 2020-08-31: 100 ug via INTRAVENOUS

## 2020-08-31 MED ORDER — OXYCODONE HCL 5 MG/5ML PO SOLN: 5.0000 mg | Freq: Once | ORAL | Status: DC | PRN

## 2020-08-31 MED ORDER — LIDOCAINE HCL (PF) 1 % IJ SOLN
INTRAMUSCULAR | Status: AC
  Filled 2020-08-31: qty 5

## 2020-08-31 MED ORDER — PROPOFOL 10 MG/ML IV BOLUS
INTRAVENOUS | Status: AC
  Filled 2020-08-31: qty 20

## 2020-08-31 MED ORDER — DOCUSATE SODIUM 100 MG PO CAPS: 100.0000 mg | ORAL_CAPSULE | Freq: Two times a day (BID) | ORAL | 0 refills | Status: AC

## 2020-08-31 MED ORDER — MIDAZOLAM HCL 2 MG/2ML IJ SOLN
INTRAMUSCULAR | Status: DC | PRN
  Administered 2020-08-31 (×2): .5 mg via INTRAVENOUS

## 2020-08-31 MED ORDER — LIDOCAINE HCL (CARDIAC) PF 100 MG/5ML IV SOSY
PREFILLED_SYRINGE | INTRAVENOUS | Status: DC | PRN
  Administered 2020-08-31: 80 mg via INTRAVENOUS

## 2020-08-31 MED ORDER — HYDROMORPHONE HCL 1 MG/ML IJ SOLN
0.2500 mg | INTRAMUSCULAR | Status: DC | PRN
  Administered 2020-08-31 (×2): 0.5 mg via INTRAVENOUS

## 2020-08-31 MED ORDER — PROPOFOL 10 MG/ML IV BOLUS
INTRAVENOUS | Status: DC | PRN
  Administered 2020-08-31: 180 mg via INTRAVENOUS

## 2020-08-31 MED ORDER — HYDROMORPHONE HCL 1 MG/ML IJ SOLN
INTRAMUSCULAR | Status: AC
  Administered 2020-08-31: 0.5 mg via INTRAVENOUS
  Filled 2020-08-31: qty 1

## 2020-08-31 MED ORDER — PROPOFOL 10 MG/ML IV BOLUS
INTRAVENOUS | Status: DC | PRN
  Administered 2020-08-31: 200 mg via INTRAVENOUS

## 2020-08-31 MED ORDER — LIDOCAINE HCL (CARDIAC) PF 100 MG/5ML IV SOSY
PREFILLED_SYRINGE | INTRAVENOUS | Status: DC | PRN
  Administered 2020-08-31: 100 mg via INTRAVENOUS

## 2020-08-31 MED ORDER — MIDAZOLAM HCL 2 MG/2ML IJ SOLN
INTRAMUSCULAR | Status: AC
  Filled 2020-08-31: qty 2

## 2020-08-31 MED ORDER — HEMOSTATIC AGENTS (NO CHARGE) OPTIME: TOPICAL | Status: DC | PRN

## 2020-08-31 MED ORDER — CHLORHEXIDINE GLUCONATE 0.12 % MT SOLN
OROMUCOSAL | Status: AC
  Administered 2020-08-31: 15 mL via OROMUCOSAL
  Filled 2020-08-31: qty 15

## 2020-08-31 MED ORDER — BUPIVACAINE LIPOSOME 1.3 % IJ SUSP
INTRAMUSCULAR | Status: AC
  Filled 2020-08-31: qty 20

## 2020-08-31 MED ORDER — CHLORHEXIDINE GLUCONATE 0.12 % MT SOLN: 15.0000 mL | Freq: Once | OROMUCOSAL | Status: AC

## 2020-08-31 MED ORDER — POVIDONE-IODINE 10 % EX SWAB: 2.0000 "application " | Freq: Once | CUTANEOUS | Status: DC

## 2020-08-31 MED ORDER — BUPIVACAINE HCL 0.5 % IJ SOLN
INTRAMUSCULAR | Status: DC | PRN
  Administered 2020-08-31: 14 mL

## 2020-08-31 MED ORDER — ACETAMINOPHEN 500 MG PO TABS
ORAL_TABLET | ORAL | Status: AC
  Administered 2020-08-31: 1000 mg via ORAL
  Filled 2020-08-31: qty 2

## 2020-08-31 MED ORDER — FENTANYL CITRATE (PF) 100 MCG/2ML IJ SOLN
25.0000 ug | INTRAMUSCULAR | Status: DC | PRN
Start: 2020-08-31 — End: 2020-09-01
  Administered 2020-08-31: 50 ug via INTRAVENOUS

## 2020-08-31 MED ORDER — FENTANYL CITRATE (PF) 100 MCG/2ML IJ SOLN
INTRAMUSCULAR | Status: AC
  Filled 2020-08-31: qty 2

## 2020-08-31 MED ORDER — ACETAMINOPHEN 500 MG PO TABS: 1000.0000 mg | ORAL_TABLET | ORAL | Status: AC

## 2020-08-31 MED ORDER — SUGAMMADEX SODIUM 200 MG/2ML IV SOLN
INTRAVENOUS | Status: DC | PRN
  Administered 2020-08-31: 200 mg via INTRAVENOUS

## 2020-08-31 MED ORDER — FENTANYL CITRATE (PF) 100 MCG/2ML IJ SOLN
INTRAMUSCULAR | Status: DC | PRN
  Administered 2020-08-31 (×2): 25 ug via INTRAVENOUS

## 2020-08-31 MED ORDER — GABAPENTIN 300 MG PO CAPS: 300.0000 mg | ORAL_CAPSULE | ORAL | Status: AC

## 2020-08-31 MED ORDER — OXYCODONE HCL 5 MG PO TABS
ORAL_TABLET | ORAL | Status: AC
  Filled 2020-08-31: qty 1

## 2020-08-31 MED ORDER — IBUPROFEN 800 MG PO TABS: 800.0000 mg | ORAL_TABLET | Freq: Three times a day (TID) | ORAL | 1 refills | Status: AC

## 2020-08-31 MED ORDER — DEXAMETHASONE SODIUM PHOSPHATE 10 MG/ML IJ SOLN
INTRAMUSCULAR | Status: DC | PRN
  Administered 2020-08-31: 10 mg via INTRAVENOUS

## 2020-08-31 MED ORDER — OXYCODONE HCL 5 MG PO TABS
5.0000 mg | ORAL_TABLET | Freq: Once | ORAL | Status: AC | PRN
  Administered 2020-08-31: 5 mg via ORAL

## 2020-08-31 MED ORDER — CEFAZOLIN SODIUM-DEXTROSE 2-4 GM/100ML-% IV SOLN
2.0000 g | INTRAVENOUS | Status: AC
  Administered 2020-08-31: 2 g via INTRAVENOUS

## 2020-08-31 MED ORDER — GLYCOPYRROLATE 0.2 MG/ML IJ SOLN
INTRAMUSCULAR | Status: DC | PRN
  Administered 2020-08-31: .2 mg via INTRAVENOUS

## 2020-08-31 MED ORDER — ONDANSETRON HCL 4 MG/2ML IJ SOLN
INTRAMUSCULAR | Status: DC | PRN
  Administered 2020-08-31: 4 mg via INTRAVENOUS

## 2020-08-31 MED ORDER — CEFAZOLIN SODIUM-DEXTROSE 2-4 GM/100ML-% IV SOLN
INTRAVENOUS | Status: AC
  Filled 2020-08-31: qty 100

## 2020-08-31 MED ORDER — FAMOTIDINE 20 MG PO TABS: 20.0000 mg | ORAL_TABLET | Freq: Once | ORAL | Status: AC

## 2020-08-31 MED ORDER — BUPIVACAINE LIPOSOME 1.3 % IJ SUSP
INTRAMUSCULAR | Status: DC | PRN
  Administered 2020-08-31: 20 mL

## 2020-08-31 MED ORDER — KETOROLAC TROMETHAMINE 30 MG/ML IJ SOLN
INTRAMUSCULAR | Status: DC | PRN
  Administered 2020-08-31: 30 mg via INTRAVENOUS

## 2020-08-31 MED ORDER — FENTANYL CITRATE (PF) 100 MCG/2ML IJ SOLN
INTRAMUSCULAR | Status: AC
  Administered 2020-08-31: 50 ug via INTRAVENOUS
  Filled 2020-08-31: qty 2

## 2020-08-31 MED ORDER — KETOROLAC TROMETHAMINE 30 MG/ML IJ SOLN
INTRAMUSCULAR | Status: AC
  Filled 2020-08-31: qty 2

## 2020-08-31 MED ORDER — FENTANYL CITRATE (PF) 100 MCG/2ML IJ SOLN: 25.0000 ug | INTRAMUSCULAR | Status: DC | PRN

## 2020-08-31 MED ORDER — GABAPENTIN 800 MG PO TABS: 800.0000 mg | ORAL_TABLET | Freq: Every day | ORAL | 0 refills | Status: AC

## 2020-08-31 MED ORDER — ACETAMINOPHEN 500 MG PO TABS: 1000.0000 mg | ORAL_TABLET | Freq: Four times a day (QID) | ORAL | 0 refills | Status: AC

## 2020-08-31 MED ORDER — OXYCODONE HCL 5 MG/5ML PO SOLN: 5.0000 mg | Freq: Once | ORAL | Status: AC | PRN

## 2020-08-31 MED ORDER — ROCURONIUM BROMIDE 100 MG/10ML IV SOLN
INTRAVENOUS | Status: DC | PRN
  Administered 2020-08-31: 50 mg via INTRAVENOUS
  Administered 2020-08-31: 30 mg via INTRAVENOUS
  Administered 2020-08-31: 10 mg via INTRAVENOUS

## 2020-08-31 MED ORDER — BUPIVACAINE HCL (PF) 0.5 % IJ SOLN
INTRAMUSCULAR | Status: AC
  Filled 2020-08-31: qty 30

## 2020-08-31 MED ORDER — DEXMEDETOMIDINE (PRECEDEX) IN NS 20 MCG/5ML (4 MCG/ML) IV SYRINGE
PREFILLED_SYRINGE | INTRAVENOUS | Status: DC | PRN
  Administered 2020-08-31: 8 ug via INTRAVENOUS
  Administered 2020-08-31: 4 ug via INTRAVENOUS
  Administered 2020-08-31: 8 ug via INTRAVENOUS

## 2020-08-31 MED ORDER — SUCCINYLCHOLINE CHLORIDE 20 MG/ML IJ SOLN
INTRAMUSCULAR | Status: DC | PRN
  Administered 2020-08-31: 100 mg via INTRAVENOUS

## 2020-08-31 MED ORDER — GABAPENTIN 300 MG PO CAPS
ORAL_CAPSULE | ORAL | Status: AC
  Administered 2020-08-31: 300 mg via ORAL
  Filled 2020-08-31: qty 1

## 2020-08-31 MED ORDER — ORAL CARE MOUTH RINSE: 15.0000 mL | Freq: Once | OROMUCOSAL | Status: AC

## 2020-08-31 MED ORDER — OXYCODONE HCL 5 MG PO TABS
5.0000 mg | ORAL_TABLET | ORAL | 0 refills | Status: AC | PRN
Start: 2020-08-31 — End: ?

## 2020-08-31 MED ORDER — OXYCODONE HCL 5 MG PO TABS: 5.0000 mg | ORAL_TABLET | Freq: Once | ORAL | Status: DC | PRN

## 2020-08-31 MED ORDER — FAMOTIDINE 20 MG PO TABS
ORAL_TABLET | ORAL | Status: AC
  Administered 2020-08-31: 20 mg via ORAL
  Filled 2020-08-31: qty 1

## 2020-08-31 MED ORDER — GLYCOPYRROLATE 0.2 MG/ML IJ SOLN
INTRAMUSCULAR | Status: AC
  Filled 2020-08-31: qty 1

## 2020-08-31 MED ORDER — MIDAZOLAM HCL 2 MG/2ML IJ SOLN
INTRAMUSCULAR | Status: DC | PRN
  Administered 2020-08-31: 2 mg via INTRAVENOUS

## 2020-08-31 SURGICAL SUPPLY — 60 items
ADH SKN CLS APL DERMABOND .7 (GAUZE/BANDAGES/DRESSINGS) ×1
APL PRP STRL LF DISP 70% ISPRP (MISCELLANEOUS) ×1
APL SRG 38 LTWT LNG FL B (MISCELLANEOUS)
APPLICATOR ARISTA FLEXITIP XL (MISCELLANEOUS) IMPLANT
BAG DRN RND TRDRP ANRFLXCHMBR (UROLOGICAL SUPPLIES)
BAG SPEC RTRVL LRG 6X4 10 (ENDOMECHANICALS)
BAG URINE DRAIN 2000ML AR STRL (UROLOGICAL SUPPLIES) IMPLANT
BLADE SURG SZ11 CARB STEEL (BLADE) ×2 IMPLANT
CATH FOLEY 2WAY  5CC 16FR (CATHETERS)
CATH FOLEY 2WAY 5CC 16FR (CATHETERS)
CATH URTH 16FR FL 2W BLN LF (CATHETERS) IMPLANT
CHLORAPREP W/TINT 26 (MISCELLANEOUS) ×2 IMPLANT
CORD MONOPOLAR M/FML 12FT (MISCELLANEOUS) IMPLANT
COVER WAND RF STERILE (DRAPES) IMPLANT
DERMABOND ADVANCED (GAUZE/BANDAGES/DRESSINGS) ×1
DERMABOND ADVANCED .7 DNX12 (GAUZE/BANDAGES/DRESSINGS) ×1 IMPLANT
DRAPE GENERAL ENDO 106X123.5 (DRAPES) IMPLANT
DRAPE LAPAROTOMY 77X122 PED (DRAPES) ×2 IMPLANT
DRAPE STERI POUCH LG 24X46 STR (DRAPES) IMPLANT
DRAPE UNDER BUTTOCK W/FLU (DRAPES) IMPLANT
GLOVE INDICATOR 7.5 STRL GRN (GLOVE) ×4 IMPLANT
GLOVE SURG ENC MOIS LTX SZ7 (GLOVE) ×4 IMPLANT
GOWN STRL REUS W/ TWL LRG LVL3 (GOWN DISPOSABLE) ×2 IMPLANT
GOWN STRL REUS W/TWL LRG LVL3 (GOWN DISPOSABLE) ×4
HEMOSTAT ARISTA ABSORB 3G PWDR (HEMOSTASIS) IMPLANT
IRRIGATION STRYKERFLOW (MISCELLANEOUS) IMPLANT
IRRIGATOR STRYKERFLOW (MISCELLANEOUS)
IV NS 1000ML (IV SOLUTION)
IV NS 1000ML BAXH (IV SOLUTION) IMPLANT
KIT PINK PAD W/HEAD ARE REST (MISCELLANEOUS) ×2
KIT PINK PAD W/HEAD ARM REST (MISCELLANEOUS) ×1 IMPLANT
KIT TURNOVER CYSTO (KITS) ×2 IMPLANT
L-HOOK LAP DISP 36CM (ELECTROSURGICAL)
LABEL OR SOLS (LABEL) IMPLANT
LHOOK LAP DISP 36CM (ELECTROSURGICAL) IMPLANT
LIGASURE VESSEL 5MM BLUNT TIP (ELECTROSURGICAL) IMPLANT
MANIFOLD NEPTUNE II (INSTRUMENTS) ×2 IMPLANT
NEEDLE FILTER BLUNT 18X 1/2SAF (NEEDLE)
NEEDLE FILTER BLUNT 18X1 1/2 (NEEDLE) IMPLANT
NS IRRIG 500ML POUR BTL (IV SOLUTION) ×2 IMPLANT
PACK BASIN MINOR ARMC (MISCELLANEOUS) ×2 IMPLANT
PACK GYN LAPAROSCOPIC (MISCELLANEOUS) IMPLANT
PAD OB MATERNITY 4.3X12.25 (PERSONAL CARE ITEMS) ×2 IMPLANT
PAD PREP 24X41 OB/GYN DISP (PERSONAL CARE ITEMS) ×2 IMPLANT
PENCIL ELECTRO HAND CTR (MISCELLANEOUS) IMPLANT
POUCH SPECIMEN RETRIEVAL 10MM (ENDOMECHANICALS) IMPLANT
SCISSORS METZENBAUM CVD 33 (INSTRUMENTS) IMPLANT
SET TUBE SMOKE EVAC HIGH FLOW (TUBING) IMPLANT
SLEEVE ENDOPATH XCEL 5M (ENDOMECHANICALS) IMPLANT
STRIP CLOSURE SKIN 1/4X4 (GAUZE/BANDAGES/DRESSINGS) IMPLANT
SUCTION FRAZIER HANDLE 10FR (MISCELLANEOUS) ×1
SUCTION TUBE FRAZIER 10FR DISP (MISCELLANEOUS) ×1 IMPLANT
SUT MNCRL AB 4-0 PS2 18 (SUTURE) ×2 IMPLANT
SUT VIC AB 2-0 UR6 27 (SUTURE) IMPLANT
SUT VIC AB 4-0 SH 27 (SUTURE)
SUT VIC AB 4-0 SH 27XANBCTRL (SUTURE) IMPLANT
SYR 50ML LL SCALE MARK (SYRINGE) IMPLANT
SYR 5ML LL (SYRINGE) ×2 IMPLANT
TROCAR XCEL NON-BLD 5MMX100MML (ENDOMECHANICALS) IMPLANT
TUBING ART PRESS 48 MALE/FEM (TUBING) IMPLANT

## 2020-08-31 SURGICAL SUPPLY — 59 items
ADH SKN CLS APL DERMABOND .7 (GAUZE/BANDAGES/DRESSINGS) ×1
APL PRP STRL LF DISP 70% ISPRP (MISCELLANEOUS) ×1
APL SRG 38 LTWT LNG FL B (MISCELLANEOUS)
APPLICATOR ARISTA FLEXITIP XL (MISCELLANEOUS) IMPLANT
BAG DRN RND TRDRP ANRFLXCHMBR (UROLOGICAL SUPPLIES) ×1
BAG URINE DRAIN 2000ML AR STRL (UROLOGICAL SUPPLIES) ×2 IMPLANT
BLADE SURG SZ11 CARB STEEL (BLADE) ×2 IMPLANT
CATH FOLEY 2WAY  5CC 16FR (CATHETERS) ×1
CATH FOLEY 2WAY 5CC 16FR (CATHETERS) ×1
CATH URTH 16FR FL 2W BLN LF (CATHETERS) ×1 IMPLANT
CHLORAPREP W/TINT 26 (MISCELLANEOUS) ×2 IMPLANT
CORD MONOPOLAR M/FML 12FT (MISCELLANEOUS) ×2 IMPLANT
COUNTER NEEDLE 20/40 LG (NEEDLE) ×4 IMPLANT
COVER LIGHT HANDLE STERIS (MISCELLANEOUS) ×4 IMPLANT
COVER WAND RF STERILE (DRAPES) IMPLANT
DERMABOND ADVANCED (GAUZE/BANDAGES/DRESSINGS) ×1
DERMABOND ADVANCED .7 DNX12 (GAUZE/BANDAGES/DRESSINGS) ×1 IMPLANT
DEVICE SUTURE ENDOST 10MM (ENDOMECHANICALS) ×2 IMPLANT
GAUZE 4X4 16PLY RFD (DISPOSABLE) ×2 IMPLANT
GLOVE INDICATOR 7.5 STRL GRN (GLOVE) ×14 IMPLANT
GLOVE SURG ENC MOIS LTX SZ7 (GLOVE) ×14 IMPLANT
GOWN STRL REUS W/ TWL LRG LVL3 (GOWN DISPOSABLE) ×5 IMPLANT
GOWN STRL REUS W/ TWL XL LVL3 (GOWN DISPOSABLE) ×1 IMPLANT
GOWN STRL REUS W/TWL LRG LVL3 (GOWN DISPOSABLE) ×10
GOWN STRL REUS W/TWL XL LVL3 (GOWN DISPOSABLE) ×2
GRASPER SUT TROCAR 14GX15 (MISCELLANEOUS) ×2 IMPLANT
HEMOSTAT ARISTA ABSORB 3G PWDR (HEMOSTASIS) IMPLANT
IRRIGATION STRYKERFLOW (MISCELLANEOUS) IMPLANT
IRRIGATOR STRYKERFLOW (MISCELLANEOUS)
KIT PINK PAD W/HEAD ARE REST (MISCELLANEOUS) ×2
KIT PINK PAD W/HEAD ARM REST (MISCELLANEOUS) ×1 IMPLANT
KIT TURNOVER CYSTO (KITS) ×2 IMPLANT
L-HOOK LAP DISP 36CM (ELECTROSURGICAL)
LABEL OR SOLS (LABEL) ×2 IMPLANT
LHOOK LAP DISP 36CM (ELECTROSURGICAL) IMPLANT
LIGASURE LAP MARYLAND 5MM 37CM (ELECTROSURGICAL) ×2 IMPLANT
MANIFOLD NEPTUNE II (INSTRUMENTS) IMPLANT
MANIPULATOR VCARE STD CRV RETR (MISCELLANEOUS) ×2 IMPLANT
NS IRRIG 500ML POUR BTL (IV SOLUTION) ×2 IMPLANT
OCCLUDER COLPOPNEUMO (BALLOONS) ×2 IMPLANT
PACK GYN LAPAROSCOPIC (MISCELLANEOUS) ×2 IMPLANT
PAD OB MATERNITY 4.3X12.25 (PERSONAL CARE ITEMS) ×2 IMPLANT
PAD PREP 24X41 OB/GYN DISP (PERSONAL CARE ITEMS) ×2 IMPLANT
PENCIL ELECTRO HAND CTR (MISCELLANEOUS) IMPLANT
SCISSORS METZENBAUM CVD 33 (INSTRUMENTS) ×2 IMPLANT
SET CYSTO W/LG BORE CLAMP LF (SET/KITS/TRAYS/PACK) IMPLANT
SLEEVE ENDOPATH XCEL 5M (ENDOMECHANICALS) ×2 IMPLANT
SPONGE GAUZE 2X2 8PLY STRL LF (GAUZE/BANDAGES/DRESSINGS) ×4 IMPLANT
SUT ENDO VLOC 180-0-8IN (SUTURE) ×2 IMPLANT
SUT MNCRL 4-0 (SUTURE) ×2
SUT MNCRL 4-0 27XMFL (SUTURE) ×1
SUT VIC AB 0 CT1 36 (SUTURE) ×4 IMPLANT
SUT VIC AB 0 CT2 27 (SUTURE) ×2 IMPLANT
SUTURE MNCRL 4-0 27XMF (SUTURE) ×1 IMPLANT
SYR 10ML LL (SYRINGE) ×2 IMPLANT
SYR 50ML LL SCALE MARK (SYRINGE) ×2 IMPLANT
TROCAR ENDO BLADELESS 11MM (ENDOMECHANICALS) ×2 IMPLANT
TROCAR XCEL NON-BLD 5MMX100MML (ENDOMECHANICALS) ×2 IMPLANT
TUBING EVAC SMOKE HEATED PNEUM (TUBING) ×2 IMPLANT

## 2020-08-31 NOTE — Anesthesia Procedure Notes (Signed)
Procedure Name: Intubation Date/Time: 08/31/2020 3:55 PM Performed by: Allean Found, CRNA Pre-anesthesia Checklist: Patient identified, Patient being monitored, Timeout performed, Emergency Drugs available and Suction available Patient Re-evaluated:Patient Re-evaluated prior to induction Oxygen Delivery Method: Circle system utilized Preoxygenation: Pre-oxygenation with 100% oxygen Induction Type: IV induction Ventilation: Mask ventilation without difficulty Laryngoscope Size: 3, McGraph and 4 Grade View: Grade I Tube type: Oral Tube size: 7.0 mm Number of attempts: 1 Airway Equipment and Method: Stylet Placement Confirmation: ETT inserted through vocal cords under direct vision,  positive ETCO2 and breath sounds checked- equal and bilateral Secured at: 21 cm Tube secured with: Tape Dental Injury: Teeth and Oropharynx as per pre-operative assessment

## 2020-08-31 NOTE — Progress Notes (Signed)
Patient seen and examined. She is stable for discharge after release of the LLQ stitch.

## 2020-08-31 NOTE — Anesthesia Preprocedure Evaluation (Signed)
Anesthesia Evaluation  Patient identified by MRN, date of birth, ID band Patient awake    Reviewed: Allergy & Precautions, H&P , NPO status , Patient's Chart, lab work & pertinent test results  History of Anesthesia Complications Negative for: history of anesthetic complications  Airway Mallampati: II  TM Distance: >3 FB Neck ROM: full    Dental  (+) Chipped   Pulmonary neg pulmonary ROS, neg shortness of breath,    Pulmonary exam normal        Cardiovascular Exercise Tolerance: Good (-) angina(-) Past MI and (-) DOE negative cardio ROS Normal cardiovascular exam     Neuro/Psych  Headaches, negative psych ROS   GI/Hepatic negative GI ROS, Neg liver ROS, neg GERD  ,  Endo/Other  negative endocrine ROS  Renal/GU      Musculoskeletal   Abdominal   Peds  Hematology negative hematology ROS (+)   Anesthesia Other Findings Past Medical History: No date: Abnormal uterine bleeding 2007: Broken arm     Comment:  left No date: Dysmenorrhea No date: Headache     Comment:  migraines  Past Surgical History: 06/15/2015: COLONOSCOPY WITH PROPOFOL; N/A     Comment:  Procedure: COLONOSCOPY WITH PROPOFOL;  Surgeon: Josefine Class, MD;  Location: [facility];  Service:               Endoscopy;  Laterality: N/A; No date: FRACTURE SURGERY; Right     Comment:  forearm     Reproductive/Obstetrics negative OB ROS                             Anesthesia Physical  Anesthesia Plan  ASA: II and emergent  Anesthesia Plan: General ETT   Post-op Pain Management:    Induction: Intravenous  PONV Risk Score and Plan: Ondansetron, Dexamethasone, Midazolam and Treatment may vary due to age or medical condition  Airway Management Planned: Oral ETT  Additional Equipment:   Intra-op Plan:   Post-operative Plan: Extubation in OR  Informed Consent: I have reviewed the patients  History and Physical, chart, labs and discussed the procedure including the risks, benefits and alternatives for the proposed anesthesia with the patient or authorized representative who has indicated his/her understanding and acceptance.     Dental Advisory Given  Plan Discussed with: Anesthesiologist, CRNA and Surgeon  Anesthesia Plan Comments: (Patient and husband consented for risks of anesthesia including but not limited to:  - adverse reactions to medications - damage to eyes, teeth, lips or other oral mucosa - nerve damage due to positioning  - sore throat or hoarseness - Damage to heart, brain, nerves, lungs, other parts of body or loss of life  They voiced understanding.)        Anesthesia Quick Evaluation

## 2020-08-31 NOTE — Interval H&P Note (Signed)
History and Physical Interval Note:  08/31/2020 9:54 AM  Nicole Boone  has presented today for surgery, with the diagnosis of abnormal uterine bleeding, failed conservative management.  The various methods of treatment have been discussed with the patient and family. After consideration of risks, benefits and other options for treatment, the patient has consented to  Procedure(s): TOTAL LAPAROSCOPIC HYSTERECTOMY WITH BILATERAL SALPINGECTOMY (Bilateral) as a surgical intervention.  The patient's history has been reviewed, patient examined, no change in status, stable for surgery.  I have reviewed the patient's chart and labs.  Questions were answered to the patient's satisfaction.     Benjaman Kindler

## 2020-08-31 NOTE — Anesthesia Procedure Notes (Signed)
Procedure Name: Intubation Date/Time: 08/31/2020 10:53 AM Performed by: Lerry Liner, CRNA Pre-anesthesia Checklist: Patient identified, Emergency Drugs available, Suction available and Patient being monitored Patient Re-evaluated:Patient Re-evaluated prior to induction Oxygen Delivery Method: Circle system utilized Preoxygenation: Pre-oxygenation with 100% oxygen Induction Type: IV induction Ventilation: Mask ventilation without difficulty Laryngoscope Size: McGraph and 3 Grade View: Grade I Tube type: Oral Tube size: 7.0 mm Number of attempts: 1 Airway Equipment and Method: Stylet Placement Confirmation: ETT inserted through vocal cords under direct vision,  positive ETCO2 and breath sounds checked- equal and bilateral Secured at: 22 cm Tube secured with: Tape Dental Injury: Teeth and Oropharynx as per pre-operative assessment

## 2020-08-31 NOTE — Anesthesia Postprocedure Evaluation (Signed)
Anesthesia Post Note  Patient: CHELSE MATAS  Procedure(s) Performed: TOTAL LAPAROSCOPIC HYSTERECTOMY WITH BILATERAL SALPINGECTOMY (Bilateral )  Patient location during evaluation: PACU Anesthesia Type: General Level of consciousness: awake and alert Pain management: pain level controlled Vital Signs Assessment: post-procedure vital signs reviewed and stable Respiratory status: spontaneous breathing, nonlabored ventilation, respiratory function stable and patient connected to nasal cannula oxygen Cardiovascular status: blood pressure returned to baseline and stable Postop Assessment: no apparent nausea or vomiting Anesthetic complications: no   No complications documented.   Last Vitals:  Vitals:   08/31/20 1311 08/31/20 1341  BP: 115/72 106/84  Pulse: 97 (!) 116  Resp: (!) 23 13  Temp:    SpO2: 99% 97%    Last Pain:  Vitals:   08/31/20 1413  TempSrc:   PainSc: 8                  Precious Haws Piscitello

## 2020-08-31 NOTE — Op Note (Signed)
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 Benjaman Kindler  ANESTHESIOLOGIST: Martha Clan, MD Anesthesiologist: Martha Clan, MD  INDICATIONS: 30 y.o. G1P0101 who woke from uncomplicated TLH/BS earlier in the day with unremitting and significant 10/10 pain only at the site of her LLQ port. No peritoneal signs, no rebound or guarding, neg Rovsings. No CVA tenderness. No vaginal bleeding. No suprapubic pain. She was able to spontaneously void in the PACU.  Pain relieved temporarily with 1% lidocaine injected at the LLQ port side. Pain worsened with light touch to skin at this area. The pain did not respond to 4mg  of Dilaudid, iv acetaminophen, iv Toradol and iv fentanyl. No right shoulder pain, no costal pain. Nothing TTP except the skin just around this LLQ port closure.  Most likely reason for this presentation is lateral nerve entrapment. Pain not relieved by 1% lidocaine at the PACU bedside enough to open in the PACU, and anesthesia recommended protecting airway as the patient has had ice chips since arriving in the PACU.  Risks of surgery were discussed with the patient including but not limited to: bleeding which may require transfusion or reoperation; infection which may require antibiotics. Written consent by husband obtained.  FINDINGS:  No subcutaneous or retroperitoneal/subrectal hematoma. No evidence of organ injury.  ANESTHESIA:    General INTRAVENOUS FLUIDS: 200 ml ESTIMATED BLOOD LOSS: none URINE OUTPUT: n/a ml SPECIMENS: None COMPLICATIONS: None immediate  PROCEDURE IN DETAIL:  The patient had sequential compression devices applied to her lower extremities while in the preoperative area.  She was then taken to the operating room where LMA anesthesia was administered and was found to be adequate.  She was  placed in the supine position, and was prepped and draped in a sterile manner.   After an adequate timeout was performed, attention was turned to the LLQ port site, which was opened with an 11-blade. The fascial stitch was removed. No bleeding or clots noted. The fascial hole was not quite 1cm in diameter, and no nerve was visualized. The fascia was infused with 88ml of Exparel. Subcutaneous 4-0 monocryl used to close the skin with Dermabond placed over. 15ml of Exparel used at the skin layer as well.  The operative site was surveyed, and it was found to be hemostatic.  The patient tolerated the procedures well.  All instruments, needles, and sponge counts were correct x 2. The patient was taken to the recovery room in stable condition.   I discussed with the patient the risk of later hernia if this fascia were left unclosed, but that with her pain and my inability to see the nerve involved, I will use long-acting local anesthetic and hope for adequate pain control. If a hernia does arise, I discussed with general surgery the repair they would recommend. In this side, omentum is the most common contents of a hernia, limiting the danger of leaving this fascial hole open today.   We will see how she feels after anesthesia, and decide whether or not to keep overnight or if further procedures indicated.

## 2020-08-31 NOTE — Discharge Instructions (Addendum)
Discharge instructions after   total laparoscopic hysterectomy   For the next three days, take ibuprofen and acetaminophen on a schedule, every 8 hours. You can take them together or you can intersperse them, and take one every four hours. I also gave you gabapentin for nighttime, to help you sleep and also to control pain. Take gabapentin medicines at night for at least the next 3 nights. You also have a narcotic, oxycodone, to take as needed if the above medicines don't help.  Postop constipation is a major cause of pain. Stay well hydrated, walk as you tolerate, and take over the counter senna as well as stool softeners if you need them.    Signs and Symptoms to Report Call our office at [phone] if you have any of the following.  . Fever over 100.4 degrees or higher . Severe stomach pain not relieved with pain medications . Bright red bleeding that's heavier than a period that does not slow with rest . To go the bathroom a lot (frequency), you can't hold your urine (urgency), or it hurts when you empty your bladder (urinate) . Chest pain . Shortness of breath . Pain in the calves of your legs . Severe nausea and vomiting not relieved with anti-nausea medications . Signs of infection around your wounds, such as redness, hot to touch, swelling, green/yellow drainage (like pus), bad smelling discharge . Any concerns  What You Can Expect after Surgery . You may see some pink tinged, bloody fluid and bruising around the wound. This is normal. . You may notice shoulder and neck pain. This is caused by the gas used during surgery to expand your abdomen so your surgeon could get to the uterus easier. . You may have a sore throat because of the tube in your mouth during general anesthesia. This will go away in 2 to 3 days. . You may have some stomach cramps. . You may notice spotting on your panties. . You may have pain around the incision sites.   Activities after Your  Discharge Follow these guidelines to help speed your recovery at home: . Do the coughing and deep breathing as you did in the hospital for 2 weeks. Use the small blue breathing device, called the incentive spirometer for 2 weeks. . Don't drive if you are in pain or taking narcotic pain medicine. You may drive when you can safely slam on the brakes, turn the wheel forcefully, and rotate your torso comfortably. This is typically 1-2 weeks. Practice in a parking lot or side street prior to attempting to drive regularly.  . Ask others to help with household chores for 4 weeks. . Do not lift anything heavier that 10 pounds for 4-6 weeks. This includes pets, children, and groceries. . Don't do strenuous activities, exercises, or sports like vacuuming, tennis, squash, etc. until your doctor says it is safe to do so. ---Maintain pelvic rest for 8 weeks. This means nothing in the vagina or rectum at all (no douching, tampons, intercourse) for 8 weeks.  . Walk as you feel able. Rest often since it may take two or three weeks for your energy level to return to normal.  . You may climb stairs . Avoid constipation:   -Eat fruits, vegetables, and whole grains. Eat small meals as your appetite will take time to return to normal.   -Drink 6 to 8 glasses of water each day unless your doctor has told you to limit your fluids.   -Use a laxative   or stool softener as needed if constipation becomes a problem. You may take Miralax, metamucil, Citrucil, Colace, Senekot, FiberCon, etc. If this does not relieve the constipation, try two tablespoons of Milk Of Magnesia every 8 hours until your bowels move.   You may shower. Gently wash the wounds with a mild soap and water. Pat dry.  Do not get in a hot tub, swimming pool, etc. for 6 weeks.  Do not use lotions, oils, powders on the wounds.  Do not douche, use tampons, or have sex until your doctor says it is okay.  Take your pain medicine when you need it. The medicine  may not work as well if the pain is bad.  Take the medicines you were taking before surgery. Other medications you will need are pain medications and possibly constipation and nausea medications (Zofran).  AMBULATORY SURGERY  DISCHARGE INSTRUCTIONS   1) The drugs that you were given will stay in your system until tomorrow so for the next 24 hours you should not:  A) Drive an automobile B) Make any legal decisions C) Drink any alcoholic beverage   2) You may resume regular meals tomorrow.  Today it is better to start with liquids and gradually work up to solid foods.  You may eat anything you prefer, but it is better to start with liquids, then soup and crackers, and gradually work up to solid foods.   3) Please notify your doctor immediately if you have any unusual bleeding, trouble breathing, redness and pain at the surgery site, drainage, fever, or pain not relieved by medication.    4) Additional Instructions:        Please contact your physician with any problems or Same Day Surgery at [phone], Monday through Friday 6 am to 4 pm, or [place] [facility] number at [phone].   Bupivacaine Liposomal Suspension for Injection - WEAR GREEN BRACELET X 3 DAYS What is this medicine? BUPIVACAINE LIPOSOMAL (bue PIV a kane LIP oh som al) is an anesthetic. It causes loss of feeling in the skin or other tissues. It is used to prevent and to treat pain from some procedures. This medicine may be used for other purposes; ask your health care provider or pharmacist if you have questions. COMMON BRAND NAME(S): EXPAREL What should I tell my health care provider before I take this medicine? They need to know if you have any of these conditions:  G6PD deficiency  heart disease  kidney disease  liver disease  low blood pressure  lung or breathing disease, like asthma  an unusual or allergic reaction to bupivacaine, other medicines, foods, dyes, or  preservatives  pregnant or trying to get pregnant  breast-feeding How should I use this medicine? This medicine is injected into the affected area. It is given by a health care provider in a hospital or clinic setting. Talk to your health care provider about the use of this medicine in children. While it may be given to children as young as 6 years for selected conditions, precautions do apply. Overdosage: If you think you have taken too much of this medicine contact a poison control center or emergency room at once. NOTE: This medicine is only for you. Do not share this medicine with others. What if I miss a dose? This does not apply. What may interact with this medicine? This medicine may interact with the following medications:  acetaminophen  certain antibiotics like dapsone, nitrofurantoin, aminosalicylic acid, sulfonamides  certain medicines for seizures like  phenobarbital, phenytoin, valproic acid  chloroquine  cyclophosphamide  flutamide  hydroxyurea  ifosfamide  metoclopramide  nitric oxide  nitroglycerin  nitroprusside  nitrous oxide  other local anesthetics like lidocaine, pramoxine, tetracaine  primaquine  quinine  rasburicase  sulfasalazine This list may not describe all possible interactions. Give your health care provider a list of all the medicines, herbs, non-prescription drugs, or dietary supplements you use. Also tell them if you smoke, drink alcohol, or use illegal drugs. Some items may interact with your medicine. What should I watch for while using this medicine? Your condition will be monitored carefully while you are receiving this medicine. Be careful to avoid injury while the area is numb, and you are not aware of pain. What side effects may I notice from receiving this medicine? Side effects that you should report to your doctor or health care professional as soon as possible:  allergic reactions like skin rash, itching or hives,  swelling of the face, lips, or tongue  seizures  signs and symptoms of a dangerous change in heartbeat or heart rhythm like chest pain; dizziness; fast, irregular heartbeat; palpitations; feeling faint or lightheaded; falls; breathing problems  signs and symptoms of methemoglobinemia such as pale, gray, or blue colored skin; headache; fast heartbeat; shortness of breath; feeling faint or lightheaded, falls; tiredness Side effects that usually do not require medical attention (report to your doctor or health care professional if they continue or are bothersome):  anxious  back pain  changes in taste  changes in vision  constipation  dizziness  fever  nausea, vomiting This list may not describe all possible side effects. Call your doctor for medical advice about side effects. You may report side effects to FDA at 1-800-FDA-1088. Where should I keep my medicine? This drug is given in a hospital or clinic and will not be stored at home. NOTE: This sheet is a summary. It may not cover all possible information. If you have questions about this medicine, talk to your doctor, pharmacist, or health care provider.  2021 Elsevier/Gold Standard (2019-10-31 12:24:57)

## 2020-08-31 NOTE — Anesthesia Preprocedure Evaluation (Signed)
Anesthesia Evaluation  Patient identified by MRN, date of birth, ID band Patient awake    Reviewed: Allergy & Precautions, H&P , NPO status , Patient's Chart, lab work & pertinent test results  History of Anesthesia Complications Negative for: history of anesthetic complications  Airway Mallampati: II  TM Distance: >3 FB Neck ROM: full    Dental  (+) Chipped   Pulmonary neg pulmonary ROS, neg shortness of breath,    Pulmonary exam normal        Cardiovascular Exercise Tolerance: Good (-) angina(-) Past MI and (-) DOE negative cardio ROS Normal cardiovascular exam     Neuro/Psych  Headaches, negative psych ROS   GI/Hepatic negative GI ROS, Neg liver ROS, neg GERD  ,  Endo/Other  negative endocrine ROS  Renal/GU      Musculoskeletal   Abdominal   Peds  Hematology negative hematology ROS (+)   Anesthesia Other Findings Past Medical History: No date: Abnormal uterine bleeding 2007: Broken arm     Comment:  left No date: Dysmenorrhea No date: Headache     Comment:  migraines  Past Surgical History: 06/15/2015: COLONOSCOPY WITH PROPOFOL; N/A     Comment:  Procedure: COLONOSCOPY WITH PROPOFOL;  Surgeon: Josefine Class, MD;  Location: [facility] ENDOSCOPY;  Service:               Endoscopy;  Laterality: N/A; No date: FRACTURE SURGERY; Right     Comment:  forearm     Reproductive/Obstetrics negative OB ROS                             Anesthesia Physical Anesthesia Plan  ASA: II  Anesthesia Plan: General ETT   Post-op Pain Management:    Induction: Intravenous  PONV Risk Score and Plan: Ondansetron, Dexamethasone, Midazolam and Treatment may vary due to age or medical condition  Airway Management Planned: Oral ETT  Additional Equipment:   Intra-op Plan:   Post-operative Plan: Extubation in OR  Informed Consent: I have reviewed the patients History and  Physical, chart, labs and discussed the procedure including the risks, benefits and alternatives for the proposed anesthesia with the patient or authorized representative who has indicated his/her understanding and acceptance.     Dental Advisory Given  Plan Discussed with: Anesthesiologist, CRNA and Surgeon  Anesthesia Plan Comments: (Patient consented for risks of anesthesia including but not limited to:  - adverse reactions to medications - damage to eyes, teeth, lips or other oral mucosa - nerve damage due to positioning  - sore throat or hoarseness - Damage to heart, brain, nerves, lungs, other parts of body or loss of life  Patient voiced understanding.)        Anesthesia Quick Evaluation

## 2020-08-31 NOTE — Transfer of Care (Signed)
Immediate Anesthesia Transfer of Care Note  Patient: Nicole Boone  Procedure(s) Performed: TOTAL LAPAROSCOPIC HYSTERECTOMY WITH BILATERAL SALPINGECTOMY (Bilateral )  Patient Location: PACU  Anesthesia Type:General  Level of Consciousness: drowsy  Airway & Oxygen Therapy: Patient Spontanous Breathing and Patient connected to face mask  Post-op Assessment: Report given to RN and Post -op Vital signs reviewed and stable  Post vital signs: Reviewed and stable  Last Vitals:  Vitals Value Taken Time  BP 115/72 08/31/20 1310  Temp    Pulse 93 08/31/20 1313  Resp 18 08/31/20 1313  SpO2 100 % 08/31/20 1313  Vitals shown include unvalidated device data.  Last Pain:  Vitals:   08/31/20 0832  TempSrc: Temporal  PainSc: 0-No pain         Complications: No complications documented.

## 2020-08-31 NOTE — Anesthesia Postprocedure Evaluation (Signed)
Anesthesia Post Note  Patient: Nicole Boone  Procedure(s) Performed: SUTURE REMOVAL--LOWER LEFT ABDOMEN  (N/A )  Patient location during evaluation: PACU Anesthesia Type: General Level of consciousness: awake and alert Pain management: pain level controlled Vital Signs Assessment: post-procedure vital signs reviewed and stable Respiratory status: spontaneous breathing, nonlabored ventilation, respiratory function stable and patient connected to nasal cannula oxygen Cardiovascular status: blood pressure returned to baseline and stable Postop Assessment: no apparent nausea or vomiting Anesthetic complications: no   No complications documented.   Last Vitals:  Vitals:   08/31/20 1845 08/31/20 1901  BP: 114/79 125/87  Pulse: (!) 105 (!) 110  Resp: (!) 22 20  Temp: (!) 36.2 C   SpO2: 93% 96%    Last Pain:  Vitals:   08/31/20 1901  TempSrc:   PainSc: 2                  Martha Clan

## 2020-08-31 NOTE — Op Note (Signed)
Encarnacion Chu PROCEDURE DATE: 08/31/2020  PREOPERATIVE DIAGNOSIS: Abnormal uterine bleeding, unresponsive to medical management   POSTOPERATIVE DIAGNOSIS: The same PROCEDURE:  TOTAL LAPAROSCOPIC HYSTERECTOMY WITH BILATERAL SALPINGECTOMY:   SURGEON:  Dr. Benjaman Kindler, MD ASSISTANT: Dr. Larey Days, MD  Anesthesiologist:  Anesthesiologist: Piscitello, Precious Haws, MD CRNA: Jerrye Noble, CRNA; Lerry Liner, CRNA Student Nurse Anesthetist: Elenora Gamma, RN  INDICATIONS: 30 y.o. [phone]  here for definitive surgical management secondary to the indications listed under preoperative diagnoses; please see preoperative note for further details.  Risks of surgery were discussed with the patient including but not limited to: bleeding which may require transfusion or reoperation; infection which may require antibiotics; injury to bowel, bladder, ureters or other surrounding organs; need for additional procedures; thromboembolic phenomenon, incisional problems and other postoperative/anesthesia complications. Written informed consent was obtained.    FINDINGS:  Small but boggy uterus, normal tubes and ovaries, simple cyst on right ovary. Small adhesion between right ovary and omentum. Normal upper abdomen.    ANESTHESIA:    General INTRAVENOUS FLUIDS:600  ml ESTIMATED BLOOD LOSS:20 ml URINE OUTPUT: 600 ml   SPECIMENS: Uterus, cervix, bilateral fallopian tubes  COMPLICATIONS: None immediate  PROCEDURE IN DETAIL:  The patient received prophalactic intravenous antibiotics and had sequential compression devices applied to her lower extremities while in the preoperative area.  She was then taken to the operating room where general anesthesia was administered and was found to be adequate.  She was placed in the dorsal lithotomy position, and was prepped and draped in a sterile manner.  A formal time out was performed with all team members present and in agreement.  A V-care uterine  manipulator was placed at this time.  A Foley catheter was inserted into her bladder and attached to constant drainage. Attention was turned to the abdomen and 0.5% Marcaine infused subq. A 10mm umbilical incision was made with the scalpel.  The Optiview 5-mm trocar and sleeve were then advanced without difficulty with the laparoscope under direct visualization into the abdomen.  The abdomen was then insufflated with carbon dioxide gas and adequate pneumoperitoneum was obtained.  A survey of the patient's pelvis and abdomen revealed the findings above.  Bilateral lower quadrant ports (5 mm on the right and 11 mm on the left) were then placed under direct visualization.  The pelvis was then carefully examined.  Attention was turned to the fallopian tubes; these were freed from the underlying mesosalpinx and the uterine attachments using the Ligasure device.  The bilateral round and broad ligaments were then clamped and transected with the Ligasure device.  The uterine artery was then skeletonized and a bladder flap was created.  The ureters were noted to be safely away from the area of dissection.  The bladder was then bluntly dissected off the lower uterine segment.    At this point, attention was turned to the uterine vessels, which were clamped and cauterized using the Ligasure on the left, and then the right. After the uterine blood flow at the level of the internal os was controlled, both arteries were cut with the Ligasure.  Good hemostasis was noted overall. Accessory vessels were noted. The uterosacral and cardinal ligaments were clamped, cut and ligated bilaterally .  Attention was then turned to the cervicovaginal junction, and monopolar scissors were used to transect the cervix from the surrounding vagina using the ring of the V-care as a guide. This was done circumferentially allowing total hysterectomy.  The uterus was then removed  from the vagina and the vaginal cuff incision was then closed with  running V-loc suture.  Overall excellent hemostasis was noted.    Attention was returned to the abdomen.The ureters were reexamined bilaterally and were pulsating normally. The abdominal pressure was reduced and hemostasis was confirmed.     The 53mm port fascia was closed with a vertical mattress with 0-Vicryl, using the cone closure system. All trocars were removed under direct visualization, and the abdomen was desufflated.  All skin incisions were closed with 4-0 Vicryl subcuticular stitches and Dermabond. The patient tolerated the procedures well.  All instruments, needles, and sponge counts were correct x 2. The patient was taken to the recovery room awake, extubated and in stable condition.

## 2020-09-01 ENCOUNTER — Encounter: Payer: Self-pay | Admitting: Obstetrics and Gynecology

## 2020-09-02 LAB — SURGICAL PATHOLOGY

## 2020-09-02 NOTE — Transfer of Care (Signed)
Immediate Anesthesia Transfer of Care Note  Patient: Nicole Boone  Procedure(s) Performed: SUTURE REMOVAL--LOWER LEFT ABDOMEN  (N/A )  Patient Location: PACU  Anesthesia Type:General  Level of Consciousness: sedated  Airway & Oxygen Therapy: Patient Spontanous Breathing and Patient connected to face mask oxygen  Post-op Assessment: Report given to RN and Post -op Vital signs reviewed and stable  Post vital signs: Reviewed and stable  Last Vitals:  Vitals Value Taken Time  BP 125/87 08/31/20 1901  Temp 36.2 C 08/31/20 1845  Pulse 110 08/31/20 1901  Resp 20 08/31/20 1901  SpO2 96 % 08/31/20 1901    Last Pain:  Vitals:   09/01/20 0818  TempSrc:   PainSc: 3          Complications: No complications documented.

## 2020-09-03 ENCOUNTER — Encounter: Payer: Self-pay | Admitting: Obstetrics and Gynecology

## 2021-04-15 IMAGING — US US PELVIS COMPLETE
1 series · 14 of 25 positions shown · non-contrast
Comparison: None available.

CLINICAL DATA: Initial evaluation for heavy vaginal bleeding, 6
weeks postpartum.

EXAM:
TRANSABDOMINAL ULTRASOUND OF PELVIS
TECHNIQUE: Transabdominal ultrasound examination of the pelvis was performed
including evaluation of the uterus, ovaries, adnexal regions, and
pelvic cul-de-sac.

[Series 1: us pelvis complete · 0.22mm/px · 14 of 46 slices shown]
[im 1/46]
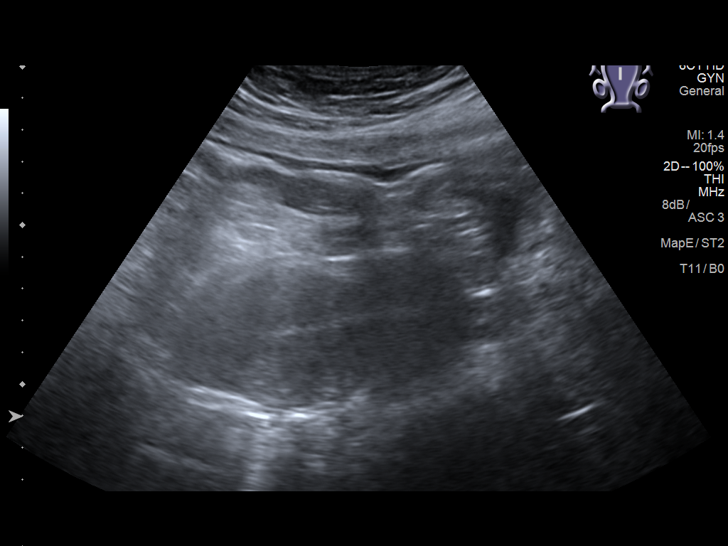
[im 4/46]
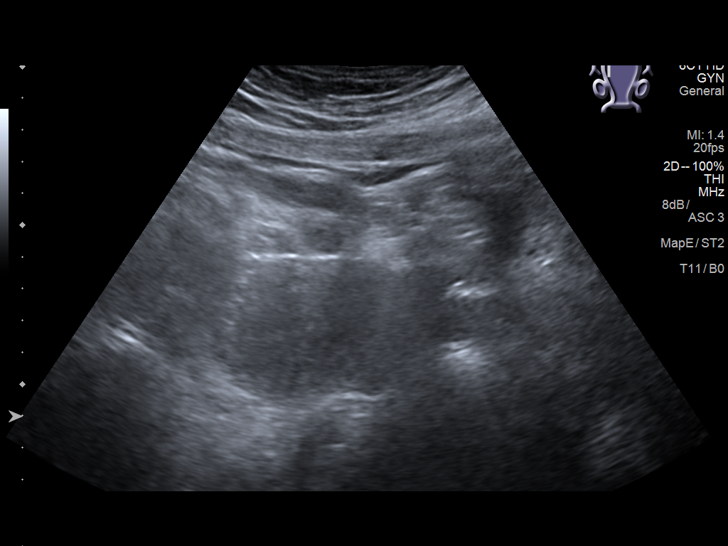
[im 8/46]
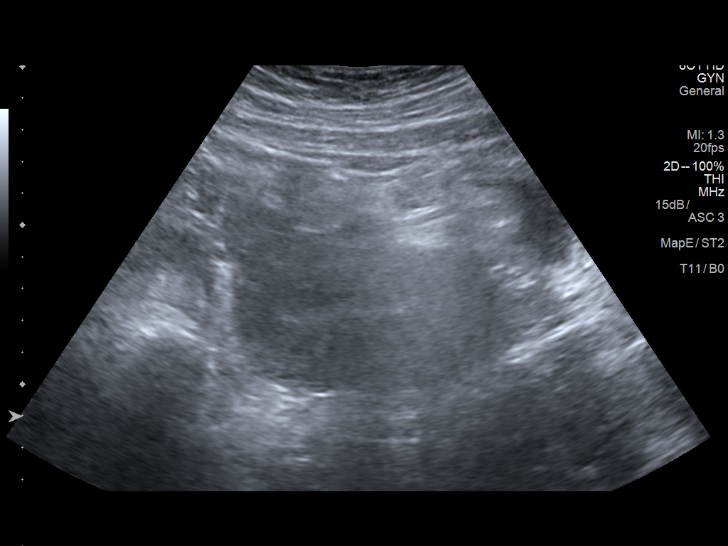
[im 12/46]
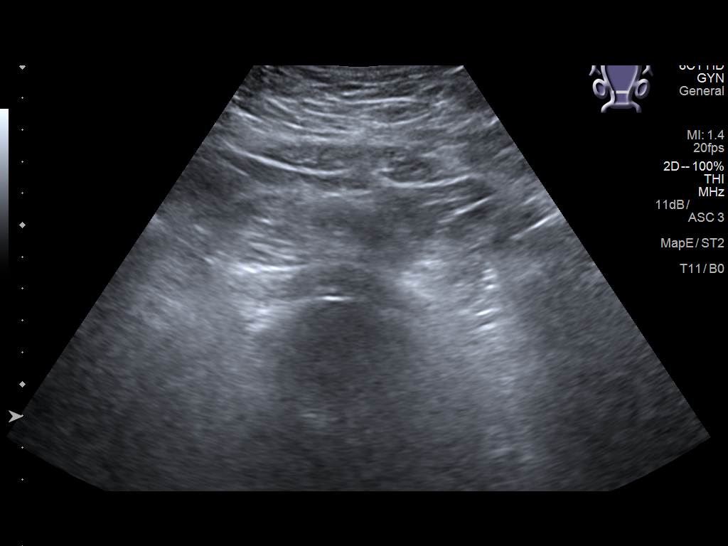
[im 16/46]
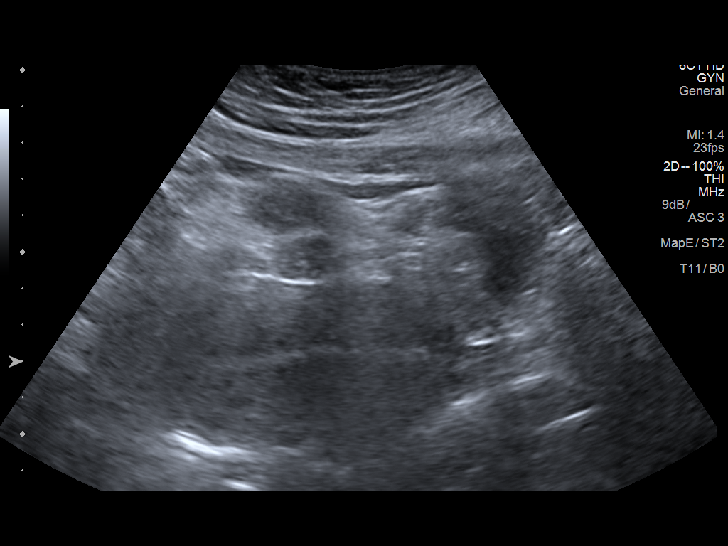
[im 17/46]
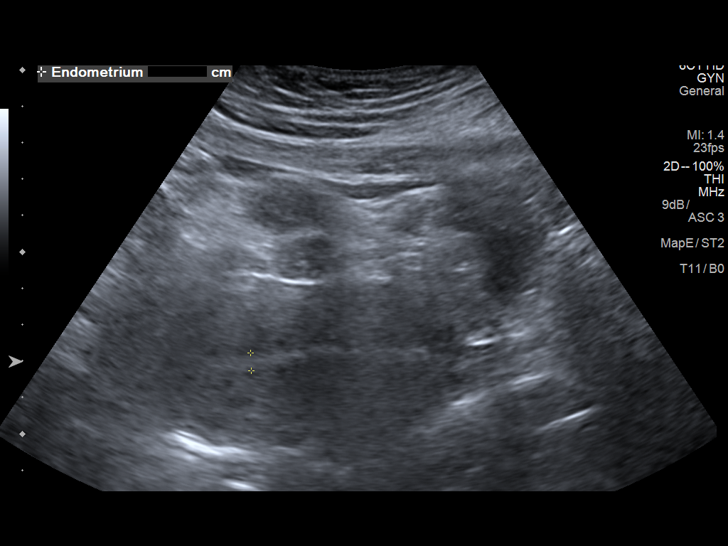
[im 21/46]
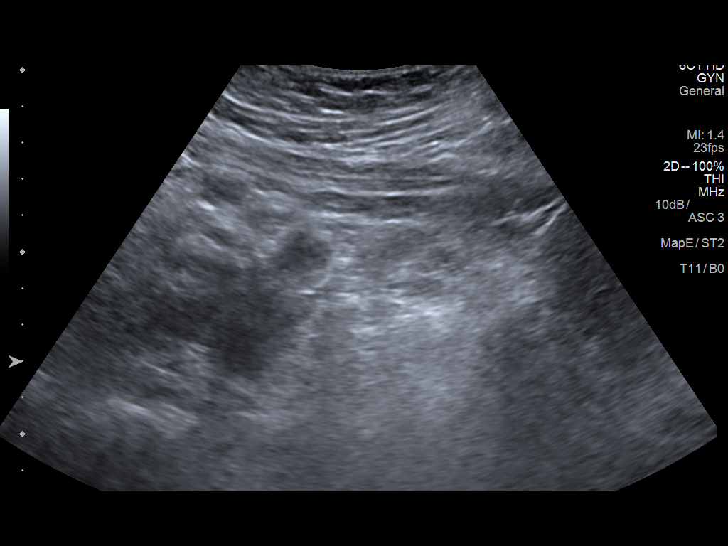
[im 25/46]
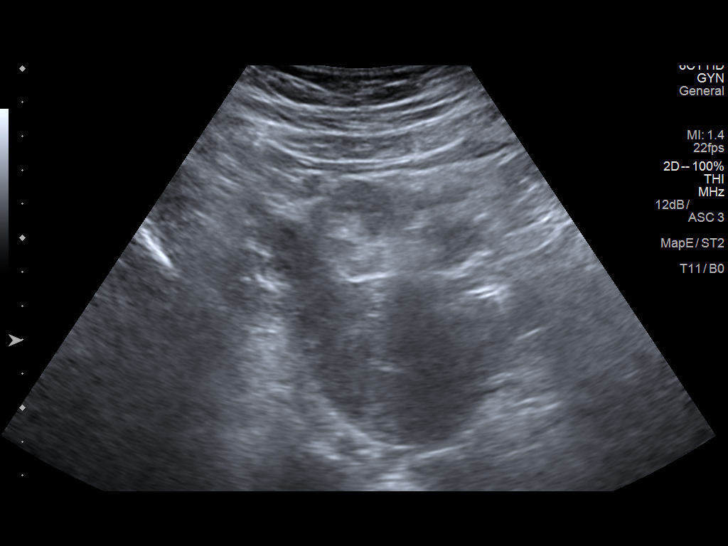
[im 29/46]
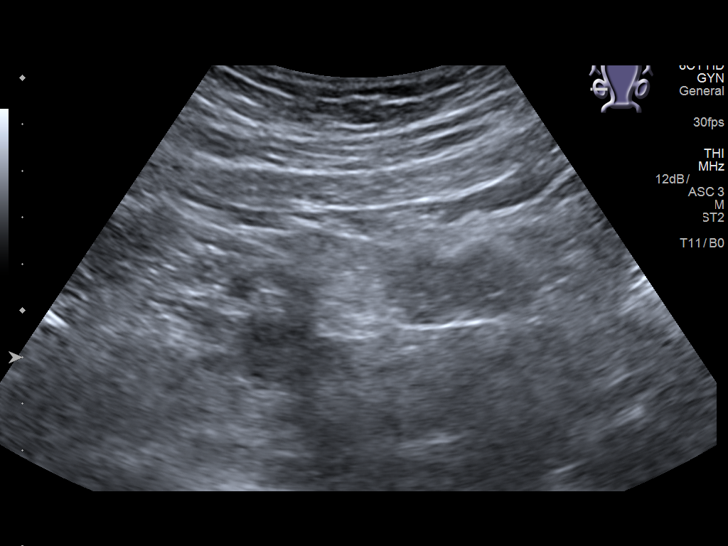
[im 31/46]
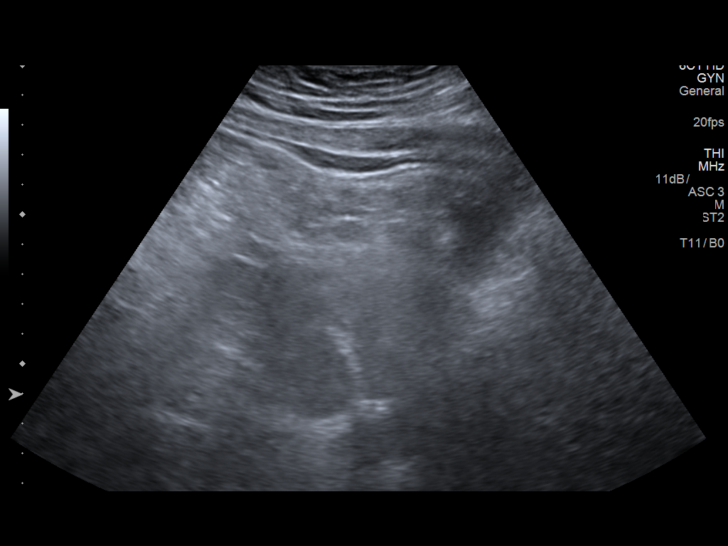
[im 34/46]
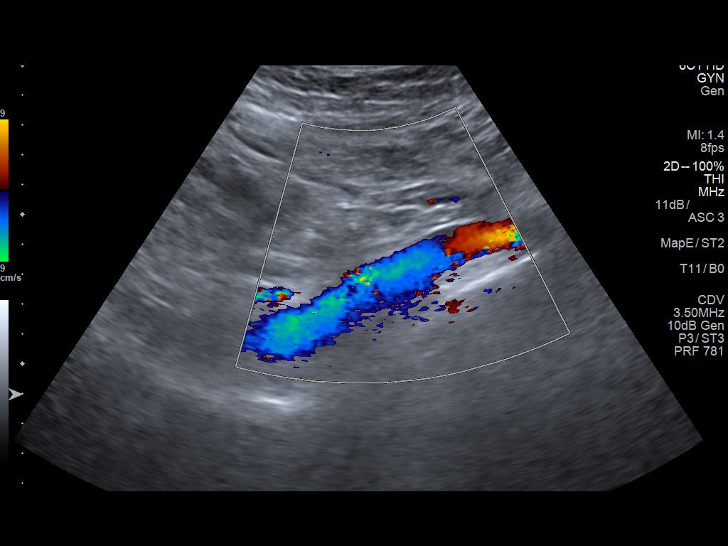
[im 38/46]
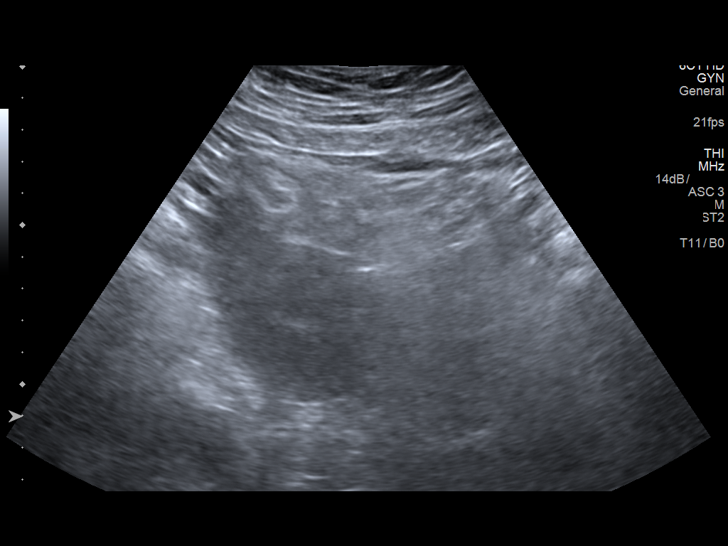
[im 42/46]
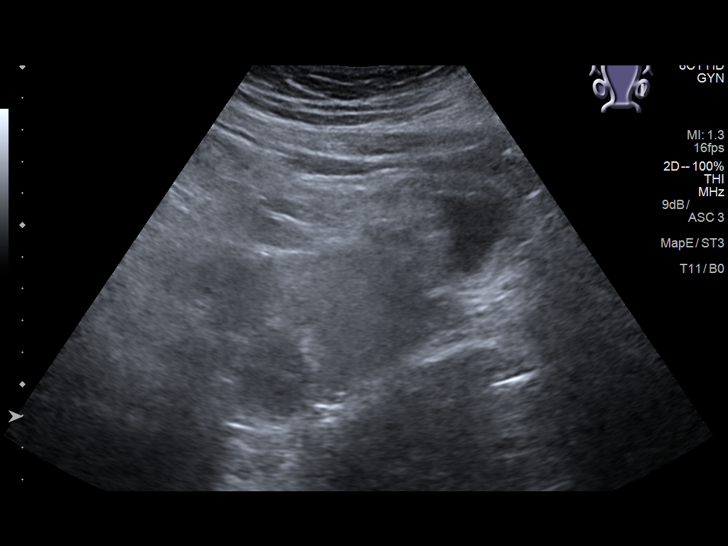
[im 46/46]
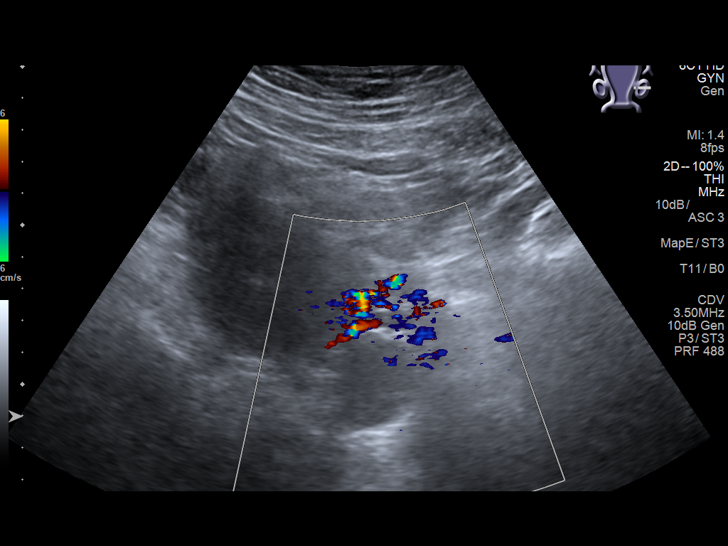

[14 of 25 positions shown; findings below may reference images not displayed]

FINDINGS: Uterus

Measurements: 8.1 x 4.5 x 5.1 cm = volume: 97.0 mL. No fibroids or
other mass visualized.

Endometrium

Thickness: 4.9 mm.  No focal abnormality visualized.

Right ovary

Measurements: 2.4 x 1.6 x 1.6 cm = volume: 3.2 mL. Normal
appearance/no adnexal mass.

Left ovary

Measurements: 3.0 x 2.1 x 2.4 cm = volume: 8.2 mL. Normal
appearance/no adnexal mass.

Other findings:  No abnormal free fluid.
IMPRESSION: Normal pelvic ultrasound. Specifically, endometrial stripe is normal
in appearance measuring 5 mm in thickness without associated
vascularity to suggest retained products.

## 2021-08-19 ENCOUNTER — Ambulatory Visit: Payer: 59 | Admitting: Dermatology

## 2021-08-19 ENCOUNTER — Other Ambulatory Visit: Payer: Self-pay

## 2021-08-19 DIAGNOSIS — L83 Acanthosis nigricans: Secondary | ICD-10-CM | POA: Diagnosis not present

## 2021-08-19 DIAGNOSIS — B360 Pityriasis versicolor: Secondary | ICD-10-CM

## 2021-08-19 DIAGNOSIS — L700 Acne vulgaris: Secondary | ICD-10-CM

## 2021-08-19 DIAGNOSIS — Z1283 Encounter for screening for malignant neoplasm of skin: Secondary | ICD-10-CM

## 2021-08-19 DIAGNOSIS — D229 Melanocytic nevi, unspecified: Secondary | ICD-10-CM

## 2021-08-19 MED ORDER — KETOCONAZOLE 2 % EX SHAM: 1.0000 "application " | MEDICATED_SHAMPOO | CUTANEOUS | 11 refills | Status: DC

## 2021-08-19 MED ORDER — KETOCONAZOLE 2 % EX SHAM: 1.0000 "application " | MEDICATED_SHAMPOO | CUTANEOUS | 6 refills | Status: AC

## 2021-08-19 NOTE — Progress Notes (Signed)
° °  New Patient Visit  Subjective  Nicole Boone is a 31 y.o. female who presents for the following: Other (New patient - Spots of inframammary areas, back, neck that are irritating). The patient presents for Upper Body Skin Exam (UBSE) for skin cancer screening and mole check.  The patient has spots, moles and lesions to be evaluated, some may be new or changing and the patient has concerns that these could be cancer.  The following portions of the chart were reviewed this encounter and updated as appropriate:   Tobacco   Allergies   Meds   Problems   Med Hx   Surg Hx   Fam Hx      Review of Systems:  No other skin or systemic complaints except as noted in HPI or Assessment and Plan.  Objective  Well appearing patient in no apparent distress; mood and affect are within normal limits.  All skin waist up examined.      Post neck Velvety brown patch       Upper back Papules    Assessment & Plan    Acanthosis nigricans Post neck She does not have a history of diabetes or polycystic ovary disease. She did have irregular menstrual cycles but she had a hysterectomy last year. She had her thyroid checked 06/2020 and it was normal.   Recommend evaluation with an endocrinologist. Will refer her to [id number] Endocr[facility]ology at [facility].  Recommend AmLactin Rapid Relief daily. Consider topical retinoid in future.  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  It is generally asymptomatic. The rash is due to overgrowth of a common type of yeast present on everyone's skin and it is not contagious.  It tends to flare more in the summer due to increased sweating on trunk.  After rash is treated, the scaliness will resolve, but the discoloration will take longer to return to normal pigmentation. The periodic use of an OTC medicated  soap/shampoo with zinc or selenium sulfide can be helpful to prevent yeast overgrowth and recurrence.  Start Ketoconazole shampoo 2-3 times per week for 8 weeks then monthly for maintenance  ketoconazole (NIZORAL) 2 % shampoo Apply 1 application topically as directed. 2-3 times per week for 8 weeks then once monthly for maintenance  Melanocytic Nevi - Tan-brown and/or pink-flesh-colored symmetric macules and papules - Benign appearing on exam today - Observation - Call clinic for new or changing moles - Recommend daily use of broad spectrum spf 30+ sunscreen to sun-exposed areas.   Acne vulgaris Upper back Will discuss treatment on follow up  Skin cancer screening  Return in about 3 months (around 11/17/2021) for Follow up.  I, Ashok Cordia, CMA, am acting as scribe for Sarina Ser, MD . Documentation: I have reviewed the above documentation for accuracy and completeness, and I agree with the above.  Sarina Ser, MD

## 2021-08-19 NOTE — Patient Instructions (Signed)

## 2021-08-22 ENCOUNTER — Encounter: Payer: Self-pay | Admitting: Dermatology

## 2021-09-08 ENCOUNTER — Other Ambulatory Visit: Payer: Self-pay

## 2021-09-08 DIAGNOSIS — L83 Acanthosis nigricans: Secondary | ICD-10-CM

## 2021-09-20 ENCOUNTER — Telehealth: Payer: Self-pay

## 2021-09-20 NOTE — Telephone Encounter (Signed)
Spoke with patient and she had not heard from any offices. Advised she can call [facility] herself to schedule so she does not have to wait any longer since referral is in the system.

## 2021-09-20 NOTE — Telephone Encounter (Signed)
Called patient as we received fax from [facility] that they are not accepting new patients at this time. I do see Hollie placed a referral to [facility] on 02/01 so I was contacting patient if she had heard from them yet.  Left VM to return my call.

## 2021-11-18 ENCOUNTER — Ambulatory Visit: Payer: 59 | Admitting: Dermatology

## 2022-09-29 ENCOUNTER — Other Ambulatory Visit: Payer: Self-pay

## 2022-09-29 ENCOUNTER — Encounter: Payer: Self-pay | Admitting: Emergency Medicine

## 2022-09-29 ENCOUNTER — Emergency Department
Admission: EM | Admit: 2022-09-29 | Discharge: 2022-09-29 | Disposition: A | Discharge status: Home / Self Care | Payer: 59 | Attending: Emergency Medicine | Admitting: Emergency Medicine

## 2022-09-29 DIAGNOSIS — H9201 Otalgia, right ear: Secondary | ICD-10-CM | POA: Diagnosis present

## 2022-09-29 DIAGNOSIS — H60391 Other infective otitis externa, right ear: Secondary | ICD-10-CM

## 2022-09-29 MED ORDER — CIPROFLOXACIN-DEXAMETHASONE 0.3-0.1 % OT SUSP
4.0000 [drp] | Freq: Two times a day (BID) | OTIC | Status: DC
  Administered 2022-09-29: 4 [drp] via OTIC
  Filled 2022-09-29: qty 7.5

## 2022-09-29 NOTE — ED Provider Notes (Signed)
[facility] Provider Note  Patient Contact: 10:12 PM (approximate)   History   Otalgia (/)   HPI  Nicole Boone is a 32 y.o. female who presents emerged part complaining of right ear pain.  Patient has had pain x 2 days.  Patient denies any swelling to the external ear structure.  Has some muffled hearing to the right ear.  Went to the walk-in clinic yesterday, diagnosed with otitis media apparently and placed on amoxicillin.  Patient states that the antibiotic has not been helping and her pain has been worsening.  No other complaints at this time.     Physical Exam   Triage Vital Signs: ED Triage Vitals [09/29/22 2122]  Enc Vitals Group     BP (!) 134/98     Pulse Rate (!) 108     Resp 16     Temp 98.9 F (37.2 C)     Temp Source Oral     SpO2 98 %     Weight 220 lb (99.8 kg)     Height '5\' 10"'$  (1.778 m)     Head Circumference      Peak Flow      Pain Score 9     Pain Loc      Pain Edu?      Excl. in [place]?     Most recent vital signs: Vitals:   09/29/22 2122  BP: (!) 134/98  Pulse: (!) 108  Resp: 16  Temp: 98.9 F (37.2 C)  SpO2: 98%     General: Alert and in no acute distress. ENT:      Ears: EAC and TM on left side is unremarkable.  EAC on right is grossly edematous, erythematous with some dried drainage along the external ear.  Edema is so great that cannot advance the speculum at this time.  Very tender to palpation of the tragus.  No tenderness over the mastoid.      Nose: No congestion/rhinnorhea.      Mouth/Throat: Mucous membranes are moist  Cardiovascular:  Good peripheral perfusion Respiratory: Normal respiratory effort without tachypnea or retractions. Lungs CTAB.  Musculoskeletal: Full range of motion to all extremities.  Neurologic:  No gross focal neurologic deficits are appreciated.  Skin:   No rash noted Other:   ED Results / Procedures / Treatments   Labs (all labs ordered are listed, but only abnormal results  are displayed) Labs Reviewed - No data to display   EKG     RADIOLOGY    No results found.  PROCEDURES:  Critical Care performed: No  Procedures   MEDICATIONS ORDERED IN ED: Medications  ciprofloxacin-dexamethasone (CIPRODEX) 0.3-0.1 % OTIC (EAR) suspension 4 drop (has no administration in time range)     IMPRESSION / MDM / ASSESSMENT AND PLAN / ED COURSE  I reviewed the triage vital signs and the nursing notes.                                 Differential diagnosis includes, but is not limited to, otitis media, otitis externa, mastoiditis   Patient's presentation is most consistent with acute presentation with potential threat to life or bodily function.   Patient's diagnosis is consistent with otitis externa.  Patient presents emergency department complaining of right ear pain.  Patient's EAC is so grossly edematous and erythematous and I am unable advance the speculum in the patient's ear.  She is  already on amoxicillin orally.  However given the fact that this appears to be otitis externa need to treat with topical antibiotic.  Ciprodex ordered from pharmacy at this time to administer tonight.  Patient be instructed to continue Ciprodex for the next week.  Follow-up with primary care as needed..  Patient is given ED precautions to return to the ED for any worsening or new symptoms.     FINAL CLINICAL IMPRESSION(S) / ED DIAGNOSES   Final diagnoses:  Other infective acute otitis externa of right ear     Rx / DC Orders   ED Discharge Orders     None        Note:  This document was prepared using Dragon voice recognition software and may include unintentional dictation errors.   Brynda Peon 09/29/22 2214    Rada Hay, MD 09/30/22 1705

## 2022-09-29 NOTE — ED Triage Notes (Signed)
Pt to ED from home c/o right ear pain that started Tuesday night.  Seen at walk in clinic yesterday and given amoxicillin yesterday and told to take tylenol for fluid behind left ear, was told unable to visualize right ear d/t swelling.

## 2022-09-29 NOTE — ED Notes (Signed)
See triage note. Pt came to ED due to R ear being swollen shut. Pt was seen at walk in clinic was given antibiotics and she was told to take tylenol for the pain but the tylenol is not helping patient with the pain whatsoever.
